# Patient Record
Sex: Male | Born: 1965 | Race: White | Hispanic: No | Marital: Married | Smoking: Current every day smoker
Health system: Southern US, Community
[De-identification: ages and names within clinical notes are randomized; demographics above are authoritative.]

## PROBLEM LIST (undated history)

## (undated) DIAGNOSIS — F419 Anxiety disorder, unspecified: Secondary | ICD-10-CM

## (undated) DIAGNOSIS — F909 Attention-deficit hyperactivity disorder, unspecified type: Secondary | ICD-10-CM

## (undated) DIAGNOSIS — F32A Depression, unspecified: Secondary | ICD-10-CM

## (undated) DIAGNOSIS — N400 Enlarged prostate without lower urinary tract symptoms: Secondary | ICD-10-CM

## (undated) DIAGNOSIS — J449 Chronic obstructive pulmonary disease, unspecified: Secondary | ICD-10-CM

## (undated) DIAGNOSIS — E785 Hyperlipidemia, unspecified: Secondary | ICD-10-CM

## (undated) DIAGNOSIS — R519 Headache, unspecified: Secondary | ICD-10-CM

## (undated) DIAGNOSIS — K219 Gastro-esophageal reflux disease without esophagitis: Secondary | ICD-10-CM

## (undated) HISTORY — DX: Anxiety disorder, unspecified: F41.9

## (undated) HISTORY — DX: Hyperlipidemia, unspecified: E78.5

## (undated) HISTORY — DX: Benign prostatic hyperplasia without lower urinary tract symptoms: N40.0

---

## 2001-12-04 ENCOUNTER — Encounter: Admission: RE | Admit: 2001-12-04 | Discharge: 2001-12-04 | Payer: Self-pay | Admitting: Otolaryngology

## 2001-12-04 ENCOUNTER — Encounter: Payer: Self-pay | Admitting: Otolaryngology

## 2008-01-24 ENCOUNTER — Ambulatory Visit (HOSPITAL_COMMUNITY): Admission: RE | Admit: 2008-01-24 | Discharge: 2008-01-24 | Payer: Self-pay | Admitting: Family Medicine

## 2010-11-30 NOTE — Procedures (Signed)
NAME:  [name], Jason [name]               ACCOUNT NO.:  [id number]   MEDICAL RECORD NO.:  [id number]          PATIENT TYPE:  OUT   LOCATION:  RAD                           FACILITY:  [facility]   PHYSICIAN:  Scott A. Gerda Diss, MD    DATE OF BIRTH:  1965-09-10   DATE OF PROCEDURE:  DATE OF DISCHARGE:                                  STRESS TEST   CHIEF COMPLAINT:  Chest discomfort.   RECENT HISTORY:  A 45 year old male smoker with intermittent chest pain  who was recently put on Protonix, which has seemed to help, but because  of family history and chest discomfort, a stress test was ordered.   Resting EKG:  No acute ST-segment changes were seen.  Normal sinus  rhythm.  He does have slight ST-segment baseline elevation in V3, V4,  and V5.  Stress test protocol was Bruce protocol.   Exercise tolerance:  He had good exercise tolerance.  He was able to get  heart rate up to a total of 167 in stage IV and blood pressures did  well.  He did not have any ST-segment depressions indicative of coronary  artery disease though did have a slight initial depression but it was  upsloping at a point but J point was normal.  No arrhythmias.  No  significant pathology except for some fatigue in the legs and had a  normal recovery span and normal blood pressure with no associated chest  pain and a normal EKG at recovery as well.  This is felt to be a  negative stress test.  He was urged to quit smoking and we will follow  through on this.      Scott A. Gerda Diss, MD  Electronically Signed     Scott A. Gerda Diss, MD  Electronically Signed    SAL/MEDQ  D:  01/24/2008  T:  01/24/2008  Job:  [id number]

## 2011-05-25 ENCOUNTER — Encounter (HOSPITAL_COMMUNITY): Payer: Self-pay | Admitting: Behavioral Health

## 2011-05-25 ENCOUNTER — Ambulatory Visit (INDEPENDENT_AMBULATORY_CARE_PROVIDER_SITE_OTHER): Payer: 59 | Admitting: Behavioral Health

## 2011-05-25 DIAGNOSIS — F411 Generalized anxiety disorder: Secondary | ICD-10-CM
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
## 2011-06-08 ENCOUNTER — Encounter (HOSPITAL_COMMUNITY): Payer: Self-pay | Admitting: Behavioral Health

## 2011-06-08 ENCOUNTER — Ambulatory Visit (INDEPENDENT_AMBULATORY_CARE_PROVIDER_SITE_OTHER): Payer: 59 | Admitting: Behavioral Health

## 2011-06-08 DIAGNOSIS — F411 Generalized anxiety disorder: Secondary | ICD-10-CM

## 2011-06-08 NOTE — Progress Notes (Signed)
THERAPIST PROGRESS NOTE  Session Time: 8:00  Participation Level: Active  Behavioral Response: Fairly GroomedAlertAnxious  Type of Therapy: Individual Therapy  Treatment Goals addressed: Anxiety  Interventions: CBT  Summary: [name] is a 45 y.o. male who presents with generalized anxiety.   Suicidal/Homicidal: Nowithout intent/plan  Therapist Response: The client indicated that he has been extremely busy with work. 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 The client indicates that he is thankful to have the work but it is difficult especially around holiday time to be working so much. He indicates there weeks where he only works 35 hours and weeks where he works a 60+ hours and that recently he has been on the upper end of the amount of hours worked. The client indicated that he does appreciate job of managing his stress at work and that the primary source of stress is within the home. He indicates that he feels at times that he has gained up on time with his wife and both of his daughters. He indicates that one of the biggest conflict between he and his wife is how they discipline their daughters. He indicated that he grew up in thought" spare the rod spoil the child." He indicates that he does not spank his daughters anymore but that he believes firmly and taking the keys away or allowance away with her phone away and his wife does not always enforce consequences which he thought they initially agreed on. He indicates that he feels when he goes to discipline his daughters his wife defends them and backs off the consequences that he tries to force. He indicated that his second biggest stressor is finances. In the previous session he indicated that he had begun showing his wife his entire check which he had not done for a couple  of years. He indicated that he thought showing her all of the check and when I went would make a difference but she still questioning where he was spending his money. He indicated that this week he has started going back to putting all of his expenses on credit cards so that he can show the receipts to his wife in hopes of leading a money trial so that he will not have to answer questions. He indicated that he will have to wait and see how his wife response to this. He indicates that he refuses to pack his lunch for work because he doesn't always have a place to heated up and that he spends no more than 8 or $9 for lunch depending on what is available to eat where he is working. He indicates that he always keeps cash in his pocket because he feels he is constantly getting one of his daughters money to go skating or to move he and his wife does not take that spending into account. He indicates that he has cut his phone time down substantially gradually over the past 4 or 5 years. He indicated that he was guilty of spending a lot of time planting when he was not working but that he now only thoughts during those season for [place] and does not want any other time of the year. He reported that he only went fishing 3 times this past summer and took his daughters every time. He indicated that he is  trying to get better about checking in with his wife instead of not turning his phone. He indicates that if he sees he is going to be late he checks in an independent in all 8 years he tends to check in on a more regular basis. He did indicate that his wife has responded to that in a more positive way he read he still feels that his wife does not trust him. He indicated that while he and his wife are separated that he was trying to help his brother-in-law with his business while his brother-in-law was sick. He indicated that the male in his brother-in-law's company was very pretty and his wife felt that he was doing something  that he should have and that evidently other men who work in a company said the client was being inappropriate with the male. The client denies that he did anything or said anything inappropriate indicates that he was not flirtatious. He indicates that his wife thinks that he at times is inappropriate with his 71 year old daughter he indicates that there is no physical relationship and he and his daughter but that they joke around a lot and he thinks that is what his wife thinks is inappropriate. The client indicates that the thought of being anything other than a father to his daughter is disgusting. The client continues indicate that he has never been unfaithful to his wife. He did indicate that before they got married he dated a lot and he thinks that plays into her mind set. The client indicates that he feels a lot of the issues between he and his wife argue to perception and he feels he is doing what he can do to change her perceptions. We will continue to explore what those perceptions aren't following sessions. He indicates that his wife has a" fantasy diversion" what family should look like and read stories on line which client feels are not realistic. He indicated that in many cases they are related to money and the client indicates that he tells his wife money does not make a family happy. The client recognizes that there are misperceptions poor communication within the family and he feels that's what needs to be worked on.  Plan: Return again in 2 weeks.  Diagnosis: Axis I: 300.02    Axis II: Deferred    French Ana, [facility] 06/08/2011

## 2011-06-29 ENCOUNTER — Encounter (HOSPITAL_COMMUNITY): Payer: Self-pay | Admitting: Behavioral Health

## 2011-06-29 ENCOUNTER — Ambulatory Visit (INDEPENDENT_AMBULATORY_CARE_PROVIDER_SITE_OTHER): Payer: 59 | Admitting: Behavioral Health

## 2011-06-29 DIAGNOSIS — F411 Generalized anxiety disorder: Secondary | ICD-10-CM
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
## 2011-07-14 ENCOUNTER — Ambulatory Visit (INDEPENDENT_AMBULATORY_CARE_PROVIDER_SITE_OTHER): Payer: 59 | Admitting: Behavioral Health

## 2011-07-14 DIAGNOSIS — F411 Generalized anxiety disorder: Secondary | ICD-10-CM

## 2011-07-15 ENCOUNTER — Encounter (HOSPITAL_COMMUNITY): Payer: Self-pay | Admitting: Behavioral Health
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
## 2011-07-26 ENCOUNTER — Encounter (HOSPITAL_COMMUNITY): Payer: Self-pay | Admitting: Behavioral Health

## 2011-07-28 ENCOUNTER — Encounter (HOSPITAL_COMMUNITY): Payer: Self-pay | Admitting: Behavioral Health

## 2011-07-28 ENCOUNTER — Ambulatory Visit (INDEPENDENT_AMBULATORY_CARE_PROVIDER_SITE_OTHER): Payer: 59 | Admitting: Behavioral Health

## 2011-07-28 NOTE — Progress Notes (Unsigned)
   THERAPIST PROGRESS NOTE  Session Time: 8:00  Participation Level: Active  Behavioral Response: Fairly GroomedAlertAnxious  Type of Therapy: Individual Therapy  Treatment Goals addressed: Anxiety  Interventions: CBT  Summary: Jason [name] is a 46 y.o. male who presents with anxiety.   Suicidal/Homicidal: Nowithout intent/plan  Therapist Response: The client entered the session indicating that he was beginning to get some kind of virus. He reportedly had no fever both coughing and had drainage and did not sleep well the past few nights. He appeared as if he had not slept well in indicated multiple times is very tired. He indicated that work stressors were playing a part because the other gentleman who helps to his job was out with pneumonia the client felt like he could not miss work. The client indicated that his biggest stressor currently is his relationship with his 36 year old daughter and how her behavior is affecting pulled the client family at this time. He indicated that they started a new medication approximately 2 or 3 weeks ago the client became even more agitated and was not going to sleep until 34 or 5:00 in the morning. Endocrine that her irritability increased substantially in the nothing anyone of the other family member said calmed her down and typically on made it worse. The client indicated that his wife took his daughter off of that medication. Advise him to speak to the doctor because most psychotropic medications need to be tapered off. The client indicated that with the exception of his daughter things have gone okay. He indicated that he and his wife and one little disagreement but for the most part they got along better he did give me a list of how she sees him which already after the session. He indicates that he sees himself as a good provider and for the most part her husband. 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 He did indicate that he knows his made mistakes his father especially when his daughter for younger is attempting to rectify that. He indicates that he knows he spent too much time based on his own hobbies and his family was younger but again is attempting to rectify that by spending less time hunting and fishing the client indicated that his anxiety level is about a 6 or 7 now it is a combination of pressures from work and issues with his daughter. We talked about coping skills the client uses. He does use being outdoors quite a bit as coping skills which has been somewhat difficult this time of year we talked about some breathing exercises which client will try.  Plan: Return again in 2 weeks.  Diagnosis: Axis I: 300.02    Axis II: Deferred    Sidharth Leverette M, LPC 07/28/2011

## 2011-08-11 ENCOUNTER — Ambulatory Visit (INDEPENDENT_AMBULATORY_CARE_PROVIDER_SITE_OTHER): Payer: 59 | Admitting: Behavioral Health

## 2011-08-11 DIAGNOSIS — F411 Generalized anxiety disorder: Secondary | ICD-10-CM

## 2011-08-18 ENCOUNTER — Encounter (HOSPITAL_COMMUNITY): Payer: Self-pay | Admitting: Behavioral Health

## 2011-08-18 DIAGNOSIS — F411 Generalized anxiety disorder: Secondary | ICD-10-CM | POA: Insufficient documentation

## 2011-08-18 NOTE — Progress Notes (Signed)
THERAPIST PROGRESS NOTE  Session Time: 8:00  Participation Level: Active  Behavioral Response: CasualAlertAnxious  Type of Therapy: Individual Therapy  Treatment Goals addressed: Anxiety  Interventions: CBT  Summary: Jason [name] is a 46 y.o. male who presents with anxiety.   Suicidal/Homicidal: Nowithout intent/plan  Therapist Response: I met for most of the session which is decline. He indicated that his wife expressed an interest in coming but she would probably be late and she did get to the session. She into the social about 10 minutes left. Prior to like him he we talked about the clients home life. He indicated that his biggest stressor is with his 11 year old daughter Jason [name]. The client indicates that she knows how to push his buttons but that he allows himself to fall into that trap. He indicates that he knows he has created a rift between himself and his daughter Jason [name] because he loses his patience and yells back to her. He indicates that he later apologizes but recognizes that he is dropping to her level in allowing her to take the power of him being a responsible parent away from him. He indicates that Jason [name] creates enough conflict in the home as it is but that he adds to it when he argues with her. He indicates that he knows he has lost most of her respect. He does appear to be genuinely concerned and has regrets about the way he has handled this relationship. He indicates that he has difficulty separating himself from work but that he knows he does a good job at work which a second by his wife's assessment of him. We spoke primarily about how the client can take the power back in what he can do to reduce conflict with both his daughter and his wife. After his wife can the session she and go to some the same sentiments she had done in the note that she provided form he. We talked about how the client relates to his daughter and the wife share her viewpoint on that saying that she  had a conversation with the daughter. The daughter said to the mother that she would like father who she could trust to sit down with and tell him what she is feeling that she does not like he can do that with the client. The client appeared to become tearful in your his wife say that for the first time. The wife and client and I talked about with the client continue to take the power back and how he can become the adult in relationship with his daughter. We just talked about communication between the client and his wife both admitted that they do a poor job of communicating with each other we briefly began to discuss communication style with each of them. The wife indicates that the client shuts down she gets series which the client knowledge indicating that he is tired talking about certain things and does not feel well but the difference. The client feels his wife overreacts to everything and does not trust him and that he saying what he is doing is correct. He also indicated that finances are a concern. Wife indicated that for years the client let her his paycheck but has stopped doing that until recently when he began doing that again. Client acknowledges that he has nothing to do with paying bills and he has to trust his wife with the money but that he feels she does not second limits with the daughter's as well as  he does. The clients wife acknowledge that fact. We also talked about cutting expenses including the truck that his 40 year old daughter striving which evidently takes about $20 every 2 days to keep running in gasoline. I recommended him to [facility] and talk to them about having any financial system to work with mother is accountability for everybody including her daughters. We will continue to meet with decline in his wife in the next session.  Plan: Return again in 2 weeks.  Diagnosis: Axis I: 300.02    Axis II: Deferred    Damoney Julia M, [facility] 08/18/2011

## 2011-08-25 ENCOUNTER — Ambulatory Visit (INDEPENDENT_AMBULATORY_CARE_PROVIDER_SITE_OTHER): Payer: 59 | Admitting: Behavioral Health

## 2011-08-25 DIAGNOSIS — F411 Generalized anxiety disorder: Secondary | ICD-10-CM

## 2011-08-26 ENCOUNTER — Encounter (HOSPITAL_COMMUNITY): Payer: Self-pay | Admitting: Behavioral Health

## 2011-08-26 NOTE — Progress Notes (Signed)
   THERAPIST PROGRESS NOTE  Session Time: 8:00  Participation Level: Active  Behavioral Response: CasualAlertAnxious  Type of Therapy: Family Therapy  Treatment Goals addressed: Coping  Interventions: CBT  Summary: YOUSUF [name] is a 46 y.o. male who presents with anxiety.   Suicidal/Homicidal: Nowithout intent/plan  Therapist Response: I met for the entire session with the client his wife and his 35 year-old daughter Jason [name]. The intention was to meet with the client and his wife saw someone surprised when his daughter walked in. The client is what indicated Jason [name] the daughter because when she woke up this morning she wrote on a piece of paper that she wanted to die and that she did not want to live and they were frightened. He showed me the paper in which she wrote that down on. The client started say to me that she really did not want to die and contracted for safety with me. She indicated that she does not want nor does she want to hurt herself or anyone else but that she just wants to live somewhere that speech will. Both client's daughter, client and his wife indicate that the house is almost constantly and chaos in that the 88 year old daughter has a part to play in it but that they need better ways of dealing with the client it with each other. We talked at length about anger triggers for the client's daughter in relation to the client and the clients mother. We also talked about anger triggers for the client in terms of his daughter and his wife. Communication among all family members needs to be improved as there is a triangle. It appears at times that the client overreacts and becomes angry following the daughter down the hallway she is angry. It appears as if the mother attempts to overcompensate for the father and he goes too far creating enmeshment. The father and his wife stated that he done better last 2 weeks as follows following her down the hall yelling but reports that he  typically follows her because when his daughter gets angry she throws and breaks that he was and is trying to keep her and everyone else safe. The daughter indicated that she gets triggered when she thinks for consequences run fair, when they follow her down the hall, and when she is not allowed to do what she wants to do. We talked about recognizing triggers physically and ourselves but spent the majority of our time working on communication within the family. I gave each homework as to why a list of strengths that the daughter sees in her parents and that each parent C's in the daughter. I will appear that with a list that the client and his wife make about each other. As the session close I did make sure that clients daughter contract for safety and she again said she had no intention of hurting herself or anyone else. Also spoke with the clients in the mother's therapist, Jason [name], to inform her of what was going on. Also asked them to schedule another appointment with Jason [name] if they had not done so recently.  Plan: Return again in 2 weeks.  Diagnosis: Axis I: 300.02    Axis II: Deferred    Xandra Laramee M, LPC 08/26/2011

## 2011-09-07 ENCOUNTER — Ambulatory Visit (HOSPITAL_COMMUNITY): Payer: Self-pay | Admitting: Behavioral Health

## 2011-09-22 ENCOUNTER — Ambulatory Visit (INDEPENDENT_AMBULATORY_CARE_PROVIDER_SITE_OTHER): Payer: 59 | Admitting: Behavioral Health

## 2011-09-22 ENCOUNTER — Encounter (HOSPITAL_COMMUNITY): Payer: Self-pay | Admitting: Behavioral Health

## 2011-09-22 DIAGNOSIS — F411 Generalized anxiety disorder: Secondary | ICD-10-CM
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
## 2011-10-06 ENCOUNTER — Encounter (HOSPITAL_COMMUNITY): Payer: Self-pay | Admitting: Behavioral Health

## 2011-10-06 ENCOUNTER — Ambulatory Visit (INDEPENDENT_AMBULATORY_CARE_PROVIDER_SITE_OTHER): Payer: 59 | Admitting: Behavioral Health

## 2011-10-06 DIAGNOSIS — F411 Generalized anxiety disorder: Secondary | ICD-10-CM
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
## 2011-10-20 ENCOUNTER — Ambulatory Visit (HOSPITAL_COMMUNITY): Payer: Self-pay | Admitting: Behavioral Health

## 2011-10-27 ENCOUNTER — Encounter (HOSPITAL_COMMUNITY): Payer: Self-pay | Admitting: Behavioral Health

## 2011-10-27 ENCOUNTER — Ambulatory Visit (INDEPENDENT_AMBULATORY_CARE_PROVIDER_SITE_OTHER): Payer: 59 | Admitting: Behavioral Health

## 2011-10-27 DIAGNOSIS — F411 Generalized anxiety disorder: Secondary | ICD-10-CM
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
## 2011-11-08 ENCOUNTER — Ambulatory Visit (INDEPENDENT_AMBULATORY_CARE_PROVIDER_SITE_OTHER): Payer: 59 | Admitting: Family Medicine

## 2011-11-08 ENCOUNTER — Encounter: Payer: Self-pay | Admitting: Family Medicine

## 2011-11-08 VITALS — BP 120/75 | HR 86 | Ht 67.0 in | Wt 159.0 lb

## 2011-11-08 DIAGNOSIS — Z23 Encounter for immunization: Secondary | ICD-10-CM

## 2011-11-08 DIAGNOSIS — Z13220 Encounter for screening for lipoid disorders: Secondary | ICD-10-CM

## 2011-11-08 DIAGNOSIS — R209 Unspecified disturbances of skin sensation: Secondary | ICD-10-CM

## 2011-11-08 DIAGNOSIS — D696 Thrombocytopenia, unspecified: Secondary | ICD-10-CM

## 2011-11-08 DIAGNOSIS — R202 Paresthesia of skin: Secondary | ICD-10-CM

## 2011-11-08 NOTE — Progress Notes (Signed)
Subjective:    Patient ID: Jason [name], male    DOB: 08/03/1965, 46 y.o.   MRN: [id number]  HPI Has occ reflux sxs but says usually just alters what he eats and it gets better.    Lump on back for years. Says not bothersome or itchy. Wife says getting larger and is concerned.   Occ will get numbness on the side of his foot and oc the left arm goes to sleep. Sometimes just the hand and sometimes the whole arm.  It will tingle.  Says has been going on for about 6 months.  Says mostly bothers him at night. No weakness or loss of strength.  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  No old injury to the foot.   Told has had low platelets in the past. About 10 years ago. Hasn't had it checked since.    Review of Systems  Constitutional: Negative for fever, diaphoresis and unexpected weight change.  HENT: Negative for hearing loss, rhinorrhea, sneezing and tinnitus.        + hay fever.   Eyes: Positive for visual disturbance.  Respiratory: Positive for cough. Negative for wheezing.   Cardiovascular: Negative for chest pain and palpitations.  Gastrointestinal: Negative for nausea, vomiting, diarrhea and blood in stool.  Genitourinary: Negative for dysuria and discharge.       Nocturia +  Musculoskeletal: Positive for myalgias and joint swelling. Negative for arthralgias.  Skin: Negative for rash.  Neurological: Positive for tremors. Negative for headaches.  Hematological: Negative for adenopathy.  Psychiatric/Behavioral: Positive for sleep disturbance. Negative for dysphoric mood. The patient is not nervous/anxious.        BP 120/75  Pulse 86  Ht 5\' 7"  (1.702 m)  Wt 159 lb (72.122 kg)  BMI 24.90 kg/m2    Allergies  Allergen Reactions  . Seasonal     Sinus and head pressure    Past Medical History  Diagnosis Date  . Anxiety     History reviewed. No pertinent past surgical history.  History   Social History  . Marital Status: Married    Spouse Name: [name]    Number of  Children: 2  . Years of Education: HS   Occupational History  . Radio broadcast assistant.      [employer]   Social History Main Topics  . Smoking status: Current Everyday Smoker -- 1.0 packs/day    Types: Cigarettes  . Smokeless tobacco: Never Used  . Alcohol Use: 0.0 oz/week    0 Cans of beer per week     drinks maybe once a month.    . Drug Use: No  . Sexually Active: Yes   Other Topics Concern  . Not on file   Social History Narrative   No regular exercise but physical job.  1 pot of coffee a day.     Family History  Problem Relation Age of Onset  . Bipolar disorder Father   . Alcohol abuse Paternal Uncle   . Heart attack Father 34    pacemaker, smoker  . Hypertension    . Alcohol abuse      No outpatient encounter prescriptions on file as of 11/08/2011.       Objective:   Physical Exam  Constitutional: He is oriented to person, place, and time. He appears well-developed and well-nourished.  HENT:  Head: Normocephalic and atraumatic.  Cardiovascular: Normal rate, regular rhythm and normal heart sounds.   Pulmonary/Chest: Effort normal and breath sounds normal.  Musculoskeletal:  Neck and shoulders with normal range of motion. He does have pain in both shoulders when he raises them above 90. Shoulder, elbow, wrist, and her strength is 5 over 5 bilaterally. He has a negative Tinel's and Phalen's sign. Handgrip is normal. Left ankle with normal range of motion. Dorsal pedal and posterior tibial pulses 2+. No signs of cyanosis. Normal cap refill. Normal sensation along the lateral edge of the foot and over all of the toes. No tenderness over the foot or rash.  Neurological: He is alert and oriented to person, place, and time.  Skin: Skin is warm and dry.  Psychiatric: He has a normal mood and affect. His behavior is normal.          Assessment & Plan:  Numbness left arm- his balance himself and was negative. This carpal tunnel is less likely but still a  possibility such as some symptoms are predominantly at night. That also could be positional. The ligament shoulder Mr. position and may also recreate his symptoms. He does have a history of rotator cuff tears bilaterally. He also has a history of osteoarthritis of the neck is on x-rays done approximately 10 years ago. Offered to rex-ray his neck today to see if some of his symptoms could be coming from but he declined. He says that he is not having any pain he really doesn't want to work it up any further. I did discuss with him if he starts to notice any weakness in the hand or arm or foot but that does require further workup and I would encourage him to come back in to be seen at that point. We will rule out diabetes evaluate for thyroid disorder.  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 Certainly if cosmetically he would like to have this removed we could certainly refer him to a general surgeon. It is nontender there is no rash. It is soft and consistent with fatty tissue. Though it is more oval in shape versus round.  Numbness of the lateral left foot-recommend further evaluation by podiatry for possible orthotics. I think this would make a big difference. He's on his feet all day and wears work boots. Consider this could also be coming from his back but he has never sciatic type symptoms he does not have any low back pain currently.  Other to get a screening cholesterol and CMP.  He reports a history of low platelets a recheck a CBC with differential today.

## 2011-11-08 NOTE — Patient Instructions (Signed)
We will call you with your lab results. If you don't here from us in about a week then please give us a call at [phone].  

## 2011-11-09 LAB — CBC WITH DIFFERENTIAL/PLATELET
Basophils Absolute: 0 10*3/uL (ref 0.0–0.1)
Basophils Relative: 1 % (ref 0–1)
Eosinophils Absolute: 0.1 10*3/uL (ref 0.0–0.7)
Eosinophils Relative: 2 % (ref 0–5)
HCT: 50.1 % (ref 39.0–52.0)
Hemoglobin: 15.9 g/dL (ref 13.0–17.0)
Lymphocytes Relative: 34 % (ref 12–46)
Lymphs Abs: 1.7 10*3/uL (ref 0.7–4.0)
MCH: 28.8 pg (ref 26.0–34.0)
MCHC: 31.7 g/dL (ref 30.0–36.0)
MCV: 90.8 fL (ref 78.0–100.0)
Monocytes Absolute: 0.4 10*3/uL (ref 0.1–1.0)
Monocytes Relative: 7 % (ref 3–12)
Neutro Abs: 2.9 10*3/uL (ref 1.7–7.7)
Neutrophils Relative %: 57 % (ref 43–77)
Platelets: 145 10*3/uL — ABNORMAL LOW (ref 150–400)
RBC: 5.52 MIL/uL (ref 4.22–5.81)
RDW: 13.3 % (ref 11.5–15.5)
WBC: 5.1 10*3/uL (ref 4.0–10.5)

## 2011-11-09 LAB — LIPID PANEL
Cholesterol: 178 mg/dL (ref 0–200)
HDL: 53 mg/dL (ref >39)
LDL Cholesterol: 115 mg/dL — ABNORMAL HIGH (ref 0–99)
Total CHOL/HDL Ratio: 3.4 Ratio
Triglycerides: 50 mg/dL (ref <150)
VLDL: 10 mg/dL (ref 0–40)

## 2011-11-09 LAB — COMPLETE METABOLIC PANEL WITH GFR
ALT: 15 U/L (ref 0–53)
AST: 19 U/L (ref 0–37)
Albumin: 4.3 g/dL (ref 3.5–5.2)
Alkaline Phosphatase: 56 U/L (ref 39–117)
BUN: 14 mg/dL (ref 6–23)
CO2: 28 mEq/L (ref 19–32)
Calcium: 9 mg/dL (ref 8.4–10.5)
Chloride: 104 mEq/L (ref 96–112)
Creat: 1.07 mg/dL (ref 0.50–1.35)
GFR, Est African American: >89 mL/min
GFR, Est Non African American: 83 mL/min
Glucose, Bld: 89 mg/dL (ref 70–99)
Potassium: 4.3 mEq/L (ref 3.5–5.3)
Sodium: 139 mEq/L (ref 135–145)
Total Bilirubin: 0.7 mg/dL (ref 0.3–1.2)
Total Protein: 6.1 g/dL (ref 6.0–8.3)

## 2011-11-09 LAB — TSH: TSH: 1.369 u[IU]/mL (ref 0.350–4.500)

## 2011-11-09 LAB — HEMOGLOBIN A1C
Hgb A1c MFr Bld: 5.4 % (ref <5.7)
Mean Plasma Glucose: 108 mg/dL (ref <117)

## 2011-11-10 ENCOUNTER — Ambulatory Visit (INDEPENDENT_AMBULATORY_CARE_PROVIDER_SITE_OTHER): Payer: 59 | Admitting: Behavioral Health

## 2011-11-10 ENCOUNTER — Encounter (HOSPITAL_COMMUNITY): Payer: Self-pay | Admitting: Behavioral Health

## 2011-11-10 DIAGNOSIS — F411 Generalized anxiety disorder: Secondary | ICD-10-CM

## 2011-11-10 NOTE — Progress Notes (Signed)
   THERAPIST PROGRESS NOTE  Session Time: 8:00  Participation Level: Active  Behavioral Response: CasualAlertAnxious  Type of Therapy: Individual Therapy  Treatment Goals addressed: Anxiety  Interventions: CBT  Summary: Jason [name] is a 46 y.o. male who presents with anxiety.   Suicidal/Homicidal: Nowithout intent/plan  Therapist Response: The client entered the session indicating that he is exhausted. He said that he has 2 or 3 guys out of work who typically there to help him because of injured backs or other injuries. He indicates that he is working about 12 hours a day and getting home at 67 or 8:00 at night and things are chaotic at home. He said that he received a phone call from his oldest daughters teacher saying that she missed 8 team days of school and that she was failing [place]. The client's daughter is a Holiday representative. He indicated that he spoke to the daughter who said that she did not care that she was failing [place]. He spoke to his wife and they both spoke to the daughter. The client questions was a daughter maybe using marijuana but that she has no interest in anything except for please her and she does not care. He said that he is drug testing her. He indicated that set some very firm boundaries with her saying that they will take her car away if she misses one more day of school and does not poor grades up quickly. That creates an intravenous with the client and his wife as the client works 2 jobs. He says he no she works hard but she also has plenty of time to getting his homework done. He cited the previous night the time she did not get home until 10:00 and said that she was in a restaurant doing homework with friends he saw no proof of. He also indicates that his younger daughter continues to be extremely oppositional and that his relationship with her is very poor. He indicated that he and his wife are doing better about working together in terms of setting limits but there  still is a lot of work to do. He indicates that he is trying to back off of being the heavy-handed disciplinarian but that he feels his wife needs to step up to the plate and be more consistent in her treatment of the clients 2 daughters. We again stressed the importance of consistency in conversation between he and his wife prior to working with their daughters. We talked about relaxation techniques and skills for the client. He indicates that he is using some which were helpful in the short-term but he stays exhausted all of the time. I may want to consider her having a conversation with the client about medication to do with anxiety.  Plan: Return again in 2 weeks.  Diagnosis: Axis I: 300.02    Axis II: Deferred    French Ana, [facility] 11/10/2011

## 2011-12-01 ENCOUNTER — Ambulatory Visit (INDEPENDENT_AMBULATORY_CARE_PROVIDER_SITE_OTHER): Payer: 59 | Admitting: Behavioral Health

## 2011-12-01 DIAGNOSIS — F411 Generalized anxiety disorder: Secondary | ICD-10-CM

## 2011-12-02 ENCOUNTER — Encounter (HOSPITAL_COMMUNITY): Payer: Self-pay | Admitting: Behavioral Health

## 2011-12-02 NOTE — Progress Notes (Signed)
   THERAPIST PROGRESS NOTE  Session Time: 8:00  Participation Level: Active  Behavioral Response: CasualAlertAnxious  Type of Therapy: Individual Therapy  Treatment Goals addressed: Anxiety  Interventions: CBT  Summary: Jason [name] is a 46 y.o. male who presents with anxiety.   Suicidal/Homicidal: Nowithout intent/plan  Therapist Response: The client entered the session late looking as if he was extremely stressed out. He indicated that he was late because he was waiting on his youngest daughter to get ready to come with them because he felt they needed to be in the session together. He stated that they are not getting along well at all and nothing he is attempting to try to repair the relationship is working. He indicated that he fell he apparently on her because she was dragging her feet getting ready. He indicated that his stress level is extremely high. He stated that work is very busy and that he does not have enough help and even though somehow hired he does not have the time to training and like he would like to. He indicated that his wife has health issues which is making her feel poorly and that his daughters are being oppositional. I applauded him for the work that he is doing attempting to repair the relationship with his youngest daughter reported that may become before she reciprocates. He indicated that he can't do much control work but can limit the amount of hours that he is putting in. He did say seeing home and he said time but he has a wide open all day long and there is no down time. 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 Also suggested as he had discussed in previous sessions that he talk to his wife over home. The client understands his wife works out of the home and does not have much of a conversation. I suggested that he stay as assertively and respectively is possible that he needs 10-15  minutes of down time on the ride home so he can be better prepared to support her in her health issues and to support her in taking care of their daughters. He indicated that he would try that. He did report that he is wife are doing much better job of presenting united front in terms of consequences and that his youngest daughter in particular his fight back against it. I told her that was normal what they needed to stick to consistent consequences and not allow the disrespect that he describes. He indicated for example that his younger daughter lost her I phoned and was accusing them of not helping her find it calling both he and the mother if he has and screaming at him. He indicated he has no tolerance for disrespect and it helped for the phone although was not found. He indicated that his wife talked about consequences for the lack of respect towards with her youngest daughter. We talked about what some of his consequences are and what his expectations are in terms of respect speaking clearly communicate this to the daughter.  Plan: Return again in 2 weeks.  Diagnosis: Axis I: 300.02    Axis II: Deferred    French Ana, [facility] 12/02/2011

## 2011-12-15 ENCOUNTER — Encounter (HOSPITAL_COMMUNITY): Payer: Self-pay | Admitting: Behavioral Health

## 2011-12-15 ENCOUNTER — Ambulatory Visit (INDEPENDENT_AMBULATORY_CARE_PROVIDER_SITE_OTHER): Payer: 59 | Admitting: Behavioral Health

## 2011-12-15 DIAGNOSIS — F411 Generalized anxiety disorder: Secondary | ICD-10-CM

## 2011-12-15 NOTE — Progress Notes (Signed)
THERAPIST PROGRESS NOTE  Session Time: 8:00  Participation Level: Active  Behavioral Response: CasualAlertAnxious  Type of Therapy: Individual Therapy  Treatment Goals addressed: Coping  Interventions: CBT  Summary: Jason [name] is a 46 y.o. male who presents with anxiety.   Suicidal/Homicidal: Nowithout intent/plan  Therapist Response: The client indicated that the last 2 weeks have been a stressful as before but for someone different reasons. He indicated that he was talking to his father on the phone and all of a sudden his father was not able to speak and had difficulty breathing. He indicated that his brother happened to be in his father's home at the time interest him to the hospital with a complex as long in his legs. The client indicated that as much as he dislikes how his father treated him growing up but still treats him he still is his father and the client respect him and does not want to do any harm but suspects that all of the health issues that the father is experiencing will either directly contribute to his death are leading to the point with the client even thinks it the father may be capable of suicide. He indicates that he is talked with father about that his father does not comment much. He indicates that his wife wants him to tell the father how he has felt about the ways been treated all these years but the client indicates that at this point time his father has not changed and he wants to leave his relationship with her current with his father as much is possible. He indicates that when his father does not he won't know that even though his father did not treat him respectively that he treated his father with respect. The client indicated that on top of that work continues to be taking about 12 hours of his times daily. He indicates that he comes in for work exhausting and a because his wife is not feeling well physically he is having to contribute more to helping out  around the house. He indicates he does not mind because he knows his wife does not feel well but that has made her more irritable. He is also very thankful that his wife is finally found a Dr. Earlene Plater going on for years with the clients wife's racing heartbeat and some other physical symptoms. He indicated that he knows now that medication needs to be adjusted but he is seeing some progress. He also said that his oldest daughter has made some very poor decisions. She was in the mall with some" friends" and one of the male friends was called for stealing from a store. Because the clients door was with the male she to has been charged with larceny of that he says she continually denies having a part in that other going back into the store to get her friends. The client also said that his daughter got a speeding ticket for going 70 and 55 2 days later. He indicates that he told the client's daughter that she was overall and in working through this. He recommended what to do about some attorneys but said that she has to begin to learn how to act like an adult that she tells him she always is. He is also concerned because he's not sure that she will graduate. He did report that his relationship with his youngest daughter is better because she lost her phone they have not replaced it so the youngest daughter spending more time  with the client and his wife is supposed in her room isolating. The client did indicate that he set boundaries as far as the phone on the way home. He indicates a causal his wife is aware home and typically does not speak want to hurt nor does he take a phone call from work. He said he is attempting to set those memories on the ride to work in the morning also. He also said that he still continues to be poorly because he is so busy work it's mid to late afternoon before his lunch and that he doesn't eat until 8:30 or 9. We talked about his choice of food even when he is eating in a hurry and how  he can make that better and how that would and heard helping to letter. Risk talked about coping skills for anxiety and stress and asked the client to practice and. I will provide him with a relaxation CD in the next session. The client does contract for safety indicating yes or thoughts of hurting himself. He indicates his depression level is not that bad but his anxiety level is extremely high.  Plan: Return again in 2 weeks.  Diagnosis: Axis I: 300.02    Axis II: Deferred    Senaya Dicenso M, LPC 12/15/2011

## 2011-12-29 ENCOUNTER — Ambulatory Visit (INDEPENDENT_AMBULATORY_CARE_PROVIDER_SITE_OTHER): Payer: 59 | Admitting: Behavioral Health

## 2011-12-29 ENCOUNTER — Encounter (HOSPITAL_COMMUNITY): Payer: Self-pay | Admitting: Behavioral Health

## 2011-12-29 DIAGNOSIS — F411 Generalized anxiety disorder: Secondary | ICD-10-CM

## 2011-12-29 NOTE — Progress Notes (Signed)
THERAPIST PROGRESS NOTE  Session Time: 8:00  Participation Level: Active  Behavioral Response: CasualAlertAnxious  Type of Therapy: Individual Therapy  Treatment Goals addressed: Anxiety  Interventions: CBT  Summary: Jason [name] is a 46 y.o. male who presents with anxiety.   Suicidal/Homicidal: Nowithout intent/plan  Therapist Response: The client indicated that he was less distressed and he was in the previous session. He did indicate that he is carving out time for herself in her home only calling his wife on up to tell her that he is coming home and not taking any work calls. He also indicated that he is balding nature sounds CD which helps him relax on the way home. He still indicates that there is significant stress at home. He reports that there still trying to get his wife's medication adjusted for dealing with her thyroid issues therefore there still significant mood swings. He indicates that he is doing his best to stay calm and not reactive feels at times as if he leaves work and comes home and works until he goes to bed. He indicates that his wife seems to be signing more fault in the fact that he is not doing enough at home or not spending enough time with family but that he is also not making enough money. He indicates that he has no idea how to balance on a. He indicates that he makes pretty good money at what he does he knows that he will not do raise. He bounces out with the fact that he knows the job market is very difficult and that he can't quit his job because he knows or play other people his boss could hire. He indicates he's been doing this for 20 some years and does not necessarily want to change careers also known that he probably cannot make the money is making somewhere else. He indicates that he feels he comes out of the home cooking dinner so much a week as well as helping clean the house. He does recognize that it must be difficult for his wife going through  the mood swings and dealing with the body temperature changes. He indicates that his wife's death of her Mircette set 69 Sundays when he comes home he and his daughter have to wear jacket. He did report that his relationship with his youngest daughter seems to be improving. He indicates that they actually do spent some time talking male which was not happening and that she does not appear he came all the time. He did say that his oldest daughter is still dealing with the issues of the larceny charges of which he does believe she's not guilty of but she was with people who did attempt to still something from a store. He also indicates that she has to deal with the speeding ticket. He did report that she is doing some of the financial responsibility for that but is not making enough to cover everything so that's financial stress to his situation. He indicates that at times his wife still brings up the fact that she he is not being honest her about his pay check and finances. He indicates a part of the comes from his wife's knowledge of his father spending habits. He believes that his father may be bipolar because his father will Borrow money or spend money randomly no one knows what is being used on. The client indicates that he shows his wife his paycheck. He also indicates that from time to time trust issues  come up. He reports that he thinks his wife deathly hasn't self image issues although she did decline is a beautiful woman. He indicates that he has never been unfaithful in any way to his wife. He indicates that he does appreciate a beautiful woman but never makes a comment on it. He indicates that if his wife thinks that he is looking at a beautiful woman and she said something to him. He referred back to his situation in which coworkers at one time told his wife that he may have been being unfaithful with a woman in the office. He indicated that he was his brother-in-law's company and that he did have work  closely with a woman but that he was not flirtatious and said or did nothing that even approach being inappropriate. He did say that in issues before the marriage he dated lot of women and at one point time before they married said he was afraid that he would not be unfaithful to her. The client wonders if that still plays in her head. He indicates that he reassures her on a continual basis but he doesn't want to be with anybody else and he has never been unfaithful to her in any way but is not sure that she believes that at times. I told him that once he felt talked with that his wife was stabilized on her medication for her thyroid issues and we could sit down and do some couples work. He welcomed  that opportunity.  Plan: Return again in 2 weeks.  Diagnosis: Axis I: 300.02    Axis II: Deferred    French Ana, [facility] 12/29/2011

## 2012-01-16 ENCOUNTER — Ambulatory Visit (INDEPENDENT_AMBULATORY_CARE_PROVIDER_SITE_OTHER): Payer: 59 | Admitting: Behavioral Health

## 2012-01-16 ENCOUNTER — Encounter (HOSPITAL_COMMUNITY): Payer: Self-pay | Admitting: Behavioral Health

## 2012-01-16 DIAGNOSIS — F411 Generalized anxiety disorder: Secondary | ICD-10-CM
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
## 2012-02-01 ENCOUNTER — Encounter (HOSPITAL_COMMUNITY): Payer: Self-pay | Admitting: Behavioral Health

## 2012-02-01 ENCOUNTER — Ambulatory Visit (INDEPENDENT_AMBULATORY_CARE_PROVIDER_SITE_OTHER): Payer: 59 | Admitting: Behavioral Health

## 2012-02-01 DIAGNOSIS — F411 Generalized anxiety disorder: Secondary | ICD-10-CM

## 2012-02-01 NOTE — Progress Notes (Signed)
   THERAPIST PROGRESS NOTE  Session Time: 8:00  Participation Level: Active  Behavioral Response: CasualAlertAnxious  Type of Therapy: Individual Therapy  Treatment Goals addressed: Coping  Interventions: CBT  Summary: Jason [name] is a 46 y.o. male who presents with anxiety.   Suicidal/Homicidal: Nowithout intent/plan  Therapist Response: The client indicated that he was extremely fatigued. He indicated that reports still very busy with the past few days at a been temperature was in the upper 90s and humid it made him extremely tired. He indicates that his wife's thyroid issues connected to graves disease have made her feel extremely uncomfortable in her doctor has now discontinued her medication to start a trial something different. He indicated that her clients have been extremely swollen and she is not sleeping well therefore he has not slept well the past few nights. He indicates that he has average 45 hours of sleep per night over the past 3-4 nights. We talked about a sleep medication that had been prescribed for him the past. He indicated that he probably need to speak to his doctor about that . I also informed him of melatonin which he can over-the-counter as a sleep aid. He indicated that he continues to have issues with his oldest daughter. He indicates that legal issues and not yet settled in that she lost her job over the weekend because she thought she was not supposed to go when he was supposed to work and lost her job. He reported that he saw her phone a picture of her blowing smoke. She told him she was smoking marijuana and that she didn't see any harm him. He indicated that he did not appreciate or do and that was not going to allow anything like that and house in wonder of all the dangers of smoking not to mention it's a legal. He also said they found out from talking to his brother to his 47 year old father have been smoking marijuana with the neighbor and his father  continues to sneak alcohol even though his doctor has told him specifically not to drink alcohol. He indicated that his youngest daughter [name] had been somewhat reclusive again over the past few days even though he felt the relationship but got better. The client indicated that he is having a hard time maintaining a positive attitude but knows he needs to as his wife does not feel well and is doing all she can do. The client is to certainly process is going on in his life. We also talked about reviewed coping skills to deal with stress and anxiety. Encouraged him to try and find at least 15-30 minutes a day for himself.   Plan:  Return again in 2 weeks.  Diagnosis: Axis I: 300.02    Axis II: Deferred    Sondos Wolfman M, [facility] 02/01/2012

## 2012-02-15 ENCOUNTER — Ambulatory Visit (HOSPITAL_COMMUNITY): Payer: Self-pay | Admitting: Behavioral Health

## 2012-03-01 ENCOUNTER — Ambulatory Visit (INDEPENDENT_AMBULATORY_CARE_PROVIDER_SITE_OTHER): Payer: 59 | Admitting: Behavioral Health

## 2012-03-01 ENCOUNTER — Encounter (HOSPITAL_COMMUNITY): Payer: Self-pay | Admitting: Behavioral Health

## 2012-03-01 DIAGNOSIS — F411 Generalized anxiety disorder: Secondary | ICD-10-CM

## 2012-03-01 DIAGNOSIS — F419 Anxiety disorder, unspecified: Secondary | ICD-10-CM

## 2012-03-01 NOTE — Progress Notes (Signed)
   THERAPIST PROGRESS NOTE  Session Time: 8:00  Participation Level: Active  Behavioral Response: CasualAlertpleasant  Type of Therapy: Family Therapy  Treatment Goals addressed: Coping  Interventions: CBT  Summary: Jason [name] is a 46 y.o. male who presents with anxiety.   Suicidal/Homicidal: Nowithout intent/plan  Therapist Response: I met with the client and his wife for the entire session. We processed some frustrations with the clients health issues. She has had thyroid issues and has been diagnosed with Graves disease. The clients wife was very complimentary in how the client had helped take care of her, and her children and their house when she either physically could not or did not feel like doing anything. We also process some issues they're having with the clients oldest daughter and the stress that she is causing them. I recommended therapy for their oldest daughter. Mother indicated that she made appointments and the daughter would refuse to go. He indicated that she is smoking marijuana and she sees no issue with that. He indicated that they no she is struggling with self-esteem issues we talked about ways that they could work with her in building that the client acknowledged his wife confirms that he can do a much better job of being positive with the client and her sister. He acknowledges that it is something that he struggles with her work also. He indicates that no matter how well his daughters do or how well his employees do he is hard time raising them but is quick to point out the negatives. We will continue to talk about that in the next session. The wife also pointed out that recently the client has told her that he was working late when in fact he was quite hunting. He indicated that he had done significantly better in reducing the amount of time that he was doing things that he wanted to do and feels that he actually told her that he was going hunting that she would  tell it he could not or get angry with him and he didn't want that. Wife indicated that she knew she had been hard to live with over the past year because of her health issues but that she recognized that he needed some stress relief also. She said that if he was later noted that he was thinking about going a certain day constant after work that she would be fine with that and not become upset. We also talked about the clients use of laughter or humor to avoid dealing with a difficult conversation or topic. We will continue to deal with that in the next session.  Plan: Return again in 2 weeks.  Diagnosis: Axis I: 300.0    Axis II: Deferred    French Ana, [facility] 03/01/2012

## 2012-04-12 ENCOUNTER — Encounter (HOSPITAL_COMMUNITY): Payer: Self-pay | Admitting: Behavioral Health

## 2012-04-12 ENCOUNTER — Ambulatory Visit (INDEPENDENT_AMBULATORY_CARE_PROVIDER_SITE_OTHER): Payer: 59 | Admitting: Behavioral Health

## 2012-04-12 DIAGNOSIS — F411 Generalized anxiety disorder: Secondary | ICD-10-CM

## 2012-04-12 NOTE — Progress Notes (Signed)
   THERAPIST PROGRESS NOTE  Session Time: 8:00  Participation Level: Active  Behavioral Response: CasualAlerttired  Type of Therapy: Individual Therapy  Treatment Goals addressed: Coping  Interventions: CBT  Summary: Jason [name] is a 46 y.o. male who presents with anxiety.   Suicidal/Homicidal: Nowithout intent/plan  Therapist Response: The client presented to him session today looking physically tired. He indicated that his allergies have really been acting out and that he has not been sleeping as well. He indicated that he is exhausted because work is busy and his wife's health issues related to her thyroid admit her very tired and irritable. He said that he never knows what he is  walking into in terms of how his wife is feeling for the day how her mood will be. he stated that he is attempting to stay positive, focused, and encouraging with his wife at times runs out of energy. He indicates that some days she is able to help another days after working all day and attempting to do to all Classes R. she does lay on the couch so he is a making better cleaning the house etc. He indicates that he does not mind helping in all but feels emotionally drained because he cannot help his wife stay focused on a positive track. He indicates that they are still attempting to adjust her medication but have not found the right combination that makes a significant difference for her. He indicates that she's always tired is often irritable is always hot. He jokingly said that he and his daughter syndrome house umbilicus R. she drinks year. He did indicate that his youngest daughter has become much more helpful around the house but their relationship has improved significantly. He indicates that he has taken a different approach with her and has backed off and appears to be reading the positive benefits from that. He indicated his older daughter has for the most part cleared up her legal issues and is working  on [employer] but appears to be stuck in a place where she cannot be motivated to do anything. He indicates that she is working 20 hours a week but otherwise has very little motivation. He indicated that conversation last night appeared to help some. He says he still has expectations especially since she's at home and only working part-time that she pick up more slapped around the house that his wife cannot do. We reviewed coping skills and I gave the client relaxation CD for both he and his wife to try and see if it work. He did contract for safety saying he is not thinking of hurting himself. He did say that he is hunting some with his brother which is his only form of relaxation escape right now but he is walking a 5 on has how much time he spends doing that he cannot be away from home very much.   Plan: Return again in 2 weeks.  Diagnosis: Axis I: 300.02    Axis II: Deferred    Najeeb Uptain M, [facility] 04/12/2012

## 2012-04-26 ENCOUNTER — Ambulatory Visit (HOSPITAL_COMMUNITY): Payer: Self-pay | Admitting: Behavioral Health

## 2012-05-10 ENCOUNTER — Ambulatory Visit (HOSPITAL_COMMUNITY): Payer: Self-pay | Admitting: Behavioral Health

## 2012-05-10 ENCOUNTER — Telehealth (HOSPITAL_COMMUNITY): Payer: Self-pay | Admitting: Behavioral Health

## 2012-05-10 NOTE — Telephone Encounter (Signed)
I called the client who indicated that he forgot his 8:00 appointment this morning. He was extremely apologetic. He is going to call back and set up another appointment.

## 2012-08-15 ENCOUNTER — Encounter: Payer: Self-pay | Admitting: Family Medicine

## 2012-08-15 DIAGNOSIS — Z720 Tobacco use: Secondary | ICD-10-CM | POA: Insufficient documentation

## 2012-10-19 ENCOUNTER — Ambulatory Visit (INDEPENDENT_AMBULATORY_CARE_PROVIDER_SITE_OTHER): Payer: 59 | Admitting: Behavioral Health

## 2012-10-19 ENCOUNTER — Encounter (HOSPITAL_COMMUNITY): Payer: Self-pay | Admitting: Behavioral Health

## 2012-10-19 DIAGNOSIS — F411 Generalized anxiety disorder: Secondary | ICD-10-CM

## 2012-10-19 NOTE — Progress Notes (Signed)
   THERAPIST PROGRESS NOTE  Session Time: 8:00  Participation Level: Active  Behavioral Response: CasualAlertAnxious  Type of Therapy: Individual Therapy  Treatment Goals addressed: Coping  Interventions: CBT  Summary: Jason [name] is a 47 y.o. male who presents with anxiety.   Suicidal/Homicidal: Nowithout intent/plan  Therapist Response: Have not seen decline since September. He stated that a lot has been going on since had seen him. He indicated to work and increased significantly in the past 3 months which was thankful for although did become almost too much saying he is working 10 hours a day most days. He did receive a dollar per hour increase which he was appreciative. He did state that things continue to improve with he and his daughter Jason [name] although she did have to be hospitalized a few months ago and they were still incidences in which she acted out toward himself and toward his wife. He stated that he has done a better job of controlling his anger and that he and his wife are doing much better job of presenting united front. He indicated his biggest challenge now is trying to balance everything. He indicates his wife continues to have health issues related to graves disease and that has kept her from wanting to get out or do much of anything is because she does not feel like physically or she has some social anxieties. He indicates that he feels he's been helpful around the house but doing most of the cleaning and some nights doing cooking said she is trying to balance health issues, work, and school. He also indicates that he feels like he needs to be of some help to his parents but they live 2 hours away in finding time to get there is difficult. We talked about. Down his responsibilities to the most basic once he feels like he can handle. We talked about the type things that he is doing for coping. I suggested that he find a time to go [place] hunting with his brother while  providing a massage for his wife in getting his 49 year old daughter to play with friends and they go have some down time. Indicates that he is tired all the time but feels like he is doing better job of managing and continues to work on improving his relationship with his daughters and his wife. He does contract for safety saying yes or thoughts of hurting himself or anyone else.  Plan: Return again in 8 weeks.  Diagnosis: Axis I: 300.02    Axis II: Deferred    Molleigh Huot M, [facility] 10/19/2012

## 2012-12-19 ENCOUNTER — Ambulatory Visit (INDEPENDENT_AMBULATORY_CARE_PROVIDER_SITE_OTHER): Payer: 59 | Admitting: Behavioral Health

## 2012-12-19 ENCOUNTER — Encounter (HOSPITAL_COMMUNITY): Payer: Self-pay | Admitting: Behavioral Health

## 2012-12-19 DIAGNOSIS — F411 Generalized anxiety disorder: Secondary | ICD-10-CM

## 2012-12-19 NOTE — Progress Notes (Signed)
   THERAPIST PROGRESS NOTE  Session Time: 8:00  Participation Level: Active  Behavioral Response: CasualAlertAnxious  Type of Therapy: Individual Therapy  Treatment Goals addressed: Coping  Interventions: CBT  Summary: Jason [name] is a 47 y.o. male who presents with anxiety.   Suicidal/Homicidal: Nowithout intent/plan  Therapist Response: I was scheduled to meet with decline in his wife. He stated that she called him as he was coming into the building saying she was overweight would be late. She did not show up. He says he probably got sidetracked until she was okay. Client does express some frustration in relation to his relationship with his wife. He acknowledges that times he mostly shuts down because he is so tired at the end of the day when his wife wants to talk to him. He also says that he readily encourages his wife to do things with her friends saying that he will take care of the children, house etc. He is aware that encourage his wife to spend time with her sister-in-law and/or friends. Talked about things that he could do differently to improve his communication and reduce their frustration. He is aware of the fact that he uses humor as a defense mechanism and difficult or serious conversations. He is also aware that he is easily distractible talked about ways he could focus more on his wife doesn't want to have conversations with him. The client has difficulty with emotional conversation so we began to work on adding can do so when they're both in session together. We talked about how his wife's love language is physical touch. He is uncomfortable in public but states that he knows he can do a better job at home of simple things such as patting her knees, rubbing her back, how are hugging her. We also talked about how he could use reflections statements to let his wife know that he is paying attention. I told him he could use" so what I hear you say is" to make sure he does  understand what she is saying and reinforce the fact that he is listening. He laughed and said it would be awkward but he could say. I encouraged him to use his own words but for that to be the message. He did say that things are better with his younger daughter and he is made much more of an effort not to respond angrily. He does say he has had to speak to her recently about how disrespectful she is to his wife. He also indicated his oldest daughter will be moving out  To live somewhat. with her boyfriend so that will change family dynamics. The client does contract for safety saying he has no thoughts of hurting himself or anyone else. We also talked about trust issues. He continues to say he has never been unfaithful to his wife and offered to take a lie detector test for his wife. We will address that together with them in the next session.  Plan: Return again in 4 weeks.  Diagnosis: Axis I: 300.02    Axis II: Deferred    Dontavion Noxon M, LPC 12/19/2012

## 2013-01-17 ENCOUNTER — Encounter (HOSPITAL_COMMUNITY): Payer: Self-pay | Admitting: Behavioral Health

## 2013-01-17 ENCOUNTER — Ambulatory Visit (INDEPENDENT_AMBULATORY_CARE_PROVIDER_SITE_OTHER): Payer: 59 | Admitting: Behavioral Health

## 2013-01-17 DIAGNOSIS — F411 Generalized anxiety disorder: Secondary | ICD-10-CM
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
## 2013-02-07 ENCOUNTER — Ambulatory Visit (INDEPENDENT_AMBULATORY_CARE_PROVIDER_SITE_OTHER): Payer: 59 | Admitting: Behavioral Health

## 2013-02-07 ENCOUNTER — Encounter (HOSPITAL_COMMUNITY): Payer: Self-pay | Admitting: Behavioral Health

## 2013-02-07 DIAGNOSIS — F411 Generalized anxiety disorder: Secondary | ICD-10-CM

## 2013-02-07 DIAGNOSIS — F330 Major depressive disorder, recurrent, mild: Secondary | ICD-10-CM

## 2013-02-07 NOTE — Progress Notes (Signed)
   THERAPIST PROGRESS NOTE  Session Time: 8:00  Participation Level: Active  Behavioral Response: CasualAlertDepressed  Type of Therapy: Individual Therapy  Treatment Goals addressed: Coping  Interventions: CBT  Summary: Jason [name] is a 47 y.o. male who presents with depression and anxiety.   Suicidal/Homicidal: Nowithout intent/plan  Therapist Response: The client Indicated that he is tired and stressed out. He indicated that work is extremely busy he is putting in a significant extra hours. He indicates that physically and mentally he is exhausted when he comes home he reported multiple stressors at home. He states that his oldest daughter moved out she's been difficult for both he and his wife. He reported that the gentleman that they are renting their house from his now said that he is going to sell the house and they are going to have to find somewhere to move by the end of 2014. He reported that he knows he has been more irritable and has lost his temper several times his wife was not her fault. He states that he knows he is also on she makes references to him acting like his father. He indicates that he does apologize but knows that he needs to control his anger before he gets to that point. We talked about different ways for him to be more aware of anger and to stop before it escalates. We also process for ways that he could emotionally available to his wife such as physically turning his body toward her when they are talking, attempting to make more eye contact and eating using simple things to firm that he is listing such as nodding his head, saying understand him and saying I know this is difficult for you. He does express a certain discomfort with displays of affection with her being at home or in public. We talked about how that could be a part of his wife's trust issues now he could rebuild trust by stretching his comfort zone with small for such as putting his hand on her knee  rubbing her back etc. The client plans to attempt to make some of these changes knowing that it needs to be gradual. The client does contract for safety saying he is thoughts of hurting himself or anyone else  Plan: Return again in 4 weeks.  Diagnosis: Axis I: 296.31/300.02    Axis II: Deferred    French Ana, [facility] 02/07/2013

## 2013-02-22 ENCOUNTER — Ambulatory Visit (INDEPENDENT_AMBULATORY_CARE_PROVIDER_SITE_OTHER): Payer: 59 | Admitting: Behavioral Health

## 2013-02-22 ENCOUNTER — Encounter (HOSPITAL_COMMUNITY): Payer: Self-pay | Admitting: Behavioral Health

## 2013-02-22 DIAGNOSIS — F411 Generalized anxiety disorder: Secondary | ICD-10-CM

## 2013-02-22 NOTE — Progress Notes (Signed)
   THERAPIST PROGRESS NOTE  Session Time: 8:00  Participation Level: Active  Behavioral Response: CasualAlertAnxious  Type of Therapy: Individual Therapy  Treatment Goals addressed: Coping  Interventions: CBT  Summary: Jason [name] is a 47 y.o. male who presents with anxiety.   Suicidal/Homicidal: Nowithout intent/plan  Therapist Response: The client received a phone call as he was walking into the session from someone at work. He indicated that even though they're busy there hours have been adjusted and to call said that he would not be or to get 40 hours this week. The client was frustrated because he knows there is more than enough for her to do and scheduling is not being done correctly and was angry that he was possibly not going to get 40 hours. Reports increasing work frustration with one of the people he worked with because he is not doing all the work that he should be and the client struggles with the fact that his coworkers not being ethical.  The client reported that his most recent thoughts have been centered around his family. He stated that he had not spoken to his sister in the past year or 2 and have very little contact with her over the past 10 years. He indicated that he realized recently that he needed to speak with sister about why that had taken place. He revolves around the parents treatment of himself, his brother and his sister. He indicated that the conversations have been very positive and I openly with his sister and he is realized how much his parents treatment of all of them has had an effect on him throughout the years and has had an effect on how he treats his wife and children. He stated that his parents have guilt at him into doing things for them over the years and he is learning to separate himself from that guilt and balance how much time he commits to his parents. He also realizes that he needs to set healthy boundaries verbally and emotionally and how he  interacts with them. He did say his wife have been telling him this for years but he felt guilty of responsibility now realizes that his wife was seeing something he either was not seeing or did not want to see. The client does contract for safety. He did say he will continue to work on setting healthy boundaries with his parents and recognizes this is a process.  Plan: Return again in 3 weeks.  Diagnosis: Axis I: 300.02    Axis II: Deferred    Dosia Yodice M, [facility] 02/22/2013

## 2013-03-13 ENCOUNTER — Encounter (HOSPITAL_COMMUNITY): Payer: Self-pay | Admitting: Behavioral Health

## 2013-03-13 ENCOUNTER — Ambulatory Visit (INDEPENDENT_AMBULATORY_CARE_PROVIDER_SITE_OTHER): Payer: 59 | Admitting: Behavioral Health

## 2013-03-13 DIAGNOSIS — F411 Generalized anxiety disorder: Secondary | ICD-10-CM

## 2013-03-13 NOTE — Progress Notes (Signed)
   THERAPIST PROGRESS NOTE  Session Time: 8:00  Participation Level: Active  Behavioral Response: CasualAlertAnxious/tired  Type of Therapy: Family Therapy  Treatment Goals addressed: Coping  Interventions: CBT  Summary: PLATO ALSPAUGH is a 47 y.o. male who presents with anxiety.   Suicidal/Homicidal: Nowithout intent/plan  Therapist Response: I met with the client and his wife for the entire session. Each has multiple stressors. The clients work has been very busy and difficult and his father has significant health issues. The clients wife is balancing work and school as well as ongoing health issues. Both report that their daughters are doing relatively well currently.  We focused on improved communication between the 2. Talked about responsibility and respect in terms of how, when, and types of information that they communicate. We talked about the importance of each of them having some down time. For the client that is hunting. 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 He is talked about the importance of anxiety reduction for both of them and how that will improve the relationship. We did review anxiety reduction techniques and I encouraged him to listen to the CD on relaxation.  Plan: Return again in 2 weeks.  Diagnosis: Axis I: 300.02    Axis II: Deferred    French Ana, [facility] 03/13/2013

## 2013-03-27 ENCOUNTER — Ambulatory Visit (HOSPITAL_COMMUNITY): Payer: Self-pay | Admitting: Behavioral Health

## 2013-05-17 ENCOUNTER — Encounter (HOSPITAL_COMMUNITY): Payer: Self-pay | Admitting: Behavioral Health

## 2013-05-17 ENCOUNTER — Ambulatory Visit (INDEPENDENT_AMBULATORY_CARE_PROVIDER_SITE_OTHER): Payer: 59 | Admitting: Behavioral Health

## 2013-05-17 DIAGNOSIS — F411 Generalized anxiety disorder: Secondary | ICD-10-CM

## 2013-05-17 NOTE — Progress Notes (Signed)
   THERAPIST PROGRESS NOTE  Session Time: 8:00  Participation Level: Active  Behavioral Response: CasualAlertAnxious  Type of Therapy: Family Therapy  Treatment Goals addressed: Coping  Interventions: CBT  Summary: Jason [name] is a 47 y.o. male who presents with anxiety and family problems.   Suicidal/Homicidal: Nowithout intent/plan  Therapist Response: I met with the client and his wife to continue to work on improved communication. There certainly are differences in perception in relation to various things including subject matter, tone of voice, cues in conversation, volume, body language. Both also bring family history is in 2 relationship which have been harmful. I renewed her request for them to meet once a week as opposed to at the end of the week and we'll both or tired from work and school. We talked about the use of language not using profanity, staying calm and using I feel statements for 30 minutes per week. Both client and his wife contract for safety.  Plan: Return again in 4 weeks.  Diagnosis: Axis I: 300.02    Axis II: Deferred    Lela Murfin M, LPC 05/17/2013

## 2013-06-12 ENCOUNTER — Encounter (HOSPITAL_COMMUNITY): Payer: Self-pay | Admitting: Behavioral Health

## 2013-06-12 ENCOUNTER — Ambulatory Visit (INDEPENDENT_AMBULATORY_CARE_PROVIDER_SITE_OTHER): Payer: 59 | Admitting: Behavioral Health

## 2013-06-12 DIAGNOSIS — F411 Generalized anxiety disorder: Secondary | ICD-10-CM

## 2013-06-12 NOTE — Progress Notes (Addendum)
   THERAPIST PROGRESS NOTE  Session Time: 8:00  Participation Level: Active  Behavioral Response: CasualAlertAnxious  Type of Therapy: Individual Therapy  Treatment Goals addressed: Coping  Interventions: CBT  Summary: JAYLYNN SIEFERT is a 47 y.o. male who presents with anxiety.   Suicidal/Homicidal: Nowithout intent/plan  Therapist Response: The client indicated that he had been on occasion a week but was still tired. He reported that he is not sleeping well part of the being anxiety and stress related to family dynamics. We explored his relationship with his wife and the attempt to improve communication that we have talked about in the past. I did agree to refer him to a marital therapist which she said both he and his wife have agreed to. The client expressed frustrations in the attempts that he made to improve things. He indicated some concern about his wife's physical health issues as well as the stress she was under and how that may be playing into the relationship. He talked about the aspects of his relationship that he could control and including his reactions to difficult or challenging situations or conversations. He does contract for safety saying he has no thoughts or hurting himself or anyone else.  Plan: Return again in 4 weeks.  Diagnosis: Axis I: 300.02    Axis II: Deferred    French Ana, [facility] 06/12/2013

## 2013-07-15 ENCOUNTER — Encounter: Payer: Self-pay | Admitting: Family Medicine

## 2013-07-15 ENCOUNTER — Ambulatory Visit: Payer: Self-pay | Admitting: Family Medicine

## 2013-07-15 ENCOUNTER — Ambulatory Visit (INDEPENDENT_AMBULATORY_CARE_PROVIDER_SITE_OTHER): Payer: BC Managed Care – PPO | Admitting: Family Medicine

## 2013-07-15 VITALS — BP 121/82 | HR 91 | Temp 98.1°F | Wt 156.0 lb

## 2013-07-15 DIAGNOSIS — A499 Bacterial infection, unspecified: Secondary | ICD-10-CM

## 2013-07-15 DIAGNOSIS — J329 Chronic sinusitis, unspecified: Secondary | ICD-10-CM

## 2013-07-15 DIAGNOSIS — B9689 Other specified bacterial agents as the cause of diseases classified elsewhere: Secondary | ICD-10-CM

## 2013-07-15 MED ORDER — DOXYCYCLINE HYCLATE 100 MG PO TABS: ORAL_TABLET | ORAL | Status: AC

## 2013-07-15 NOTE — Progress Notes (Signed)
CC: Jason [name] is a 47 y.o. male is here for Cough   Subjective: HPI:  Complains of facial pressure localized in the forehead accompanied by thick nasal discharge that has been present for the last 4 days worsening on a daily basis. Symptoms are worse in the evenings and in the mornings also when leaning forward. No interventions as of yet. Fatigue but no fevers or chills. Reports nonproductive cough that has been also present for 4 days.mild in severity . symptoms are present all hours of the day.   denies shortness of breath, wheezing, nor chest pain   Review Of Systems O Complains ofutlined In HPI  Past Medical History  Diagnosis Date  . Anxiety      Family History  Problem Relation Age of Onset  . Bipolar disorder Father   . Heart attack Father 4    pacemaker, smoker  . Alcohol abuse Paternal Uncle   . Hypertension    . Alcohol abuse       History  Substance Use Topics  . Smoking status: Current Every Day Smoker -- 1.00 packs/day    Types: Cigarettes  . Smokeless tobacco: Never Used  . Alcohol Use: 0.0 oz/week    0 Cans of beer per week     Comment: drinks maybe once a month.       Objective: Filed Vitals:   07/15/13 1144  BP: 121/82  Pulse: 91  Temp: 98.1 F (36.7 C)    General: Alert and Oriented, No Acute Distress HEENT: Pupils equal, round, reactive to light. Conjunctivae clear.  External ears unremarkable, canals clear with intact TMs with appropriate landmarks.   left middle ear unremarkable right middle ear with serous effusion .  boggy erythematous inferior turbinates with moderate mucoid discharge.  Moist mucous membranes, pharynx without inflammation nor lesions.  Neck supple without palpable lymphadenopathy nor abnormal masses. frontal sinus tenderness to percussion bilaterally Lungs:  comfortable work of breathing with trace end expiratory wheezing in left lung fields. No signs of consolidation no rhonchi or rales Mental Status: No depression,  anxiety, nor agitation. Skin: Warm and dry.  Assessment & Plan: Jason [name] was seen today for cough.  Diagnoses and associated orders for this visit:  Bacterial sinusitis - doxycycline (VIBRA-TABS) 100 MG tablet; One by mouth twice a day for ten days.     bacterial sinusitis: Start alkaselzer cold and Sinus on an as-needed basis likely viral etiology at this point. Also consider nasal saline washes. If Symptoms persist beyond 7-10 days strongly consider starting doxycycline   Return if symptoms worsen or fail to improve.

## 2013-07-26 ENCOUNTER — Ambulatory Visit (HOSPITAL_COMMUNITY): Payer: Self-pay | Admitting: Behavioral Health

## 2013-09-17 ENCOUNTER — Encounter (HOSPITAL_COMMUNITY): Payer: Self-pay | Admitting: Behavioral Health

## 2014-10-07 ENCOUNTER — Telehealth: Payer: Self-pay | Admitting: Family Medicine

## 2014-10-07 NOTE — Telephone Encounter (Signed)
Ok, changed to [facility]

## 2014-10-07 NOTE — Telephone Encounter (Signed)
Ok with me 

## 2014-10-07 NOTE — Telephone Encounter (Signed)
Patient called request to know if he can change providers from Dr. Linford ArnoldMetheney to Dr. Ivan AnchorsHommel. Pt has seen Dr. Linford ArnoldMetheney one time and is just requesting a male doctor. Thanks

## 2014-10-15 ENCOUNTER — Ambulatory Visit (INDEPENDENT_AMBULATORY_CARE_PROVIDER_SITE_OTHER): Payer: BLUE CROSS/BLUE SHIELD | Admitting: Family Medicine

## 2014-10-15 ENCOUNTER — Encounter: Payer: Self-pay | Admitting: Family Medicine

## 2014-10-15 VITALS — BP 113/75 | HR 79 | Ht 67.0 in | Wt 160.0 lb

## 2014-10-15 DIAGNOSIS — N400 Enlarged prostate without lower urinary tract symptoms: Secondary | ICD-10-CM

## 2014-10-15 DIAGNOSIS — Z0000 Encounter for general adult medical examination without abnormal findings: Secondary | ICD-10-CM | POA: Diagnosis not present

## 2014-10-15 HISTORY — DX: Benign prostatic hyperplasia without lower urinary tract symptoms: N40.0

## 2014-10-15 LAB — COMPLETE METABOLIC PANEL WITH GFR
ALT: 15 U/L (ref 0–53)
AST: 20 U/L (ref 0–37)
Albumin: 4.2 g/dL (ref 3.5–5.2)
Alkaline Phosphatase: 55 U/L (ref 39–117)
BUN: 15 mg/dL (ref 6–23)
CO2: 29 mEq/L (ref 19–32)
Calcium: 9.1 mg/dL (ref 8.4–10.5)
Chloride: 103 mEq/L (ref 96–112)
Creat: 1.06 mg/dL (ref 0.50–1.35)
GFR, Est African American: >89 mL/min
GFR, Est Non African American: 82 mL/min
Glucose, Bld: 89 mg/dL (ref 70–99)
Potassium: 4.2 mEq/L (ref 3.5–5.3)
Sodium: 139 mEq/L (ref 135–145)
Total Bilirubin: 0.8 mg/dL (ref 0.2–1.2)
Total Protein: 6.7 g/dL (ref 6.0–8.3)

## 2014-10-15 LAB — CBC
HCT: 48.5 % (ref 39.0–52.0)
Hemoglobin: 16.3 g/dL (ref 13.0–17.0)
MCH: 29.2 pg (ref 26.0–34.0)
MCHC: 33.6 g/dL (ref 30.0–36.0)
MCV: 86.8 fL (ref 78.0–100.0)
MPV: 11.6 fL (ref 8.6–12.4)
Platelets: 151 10*3/uL (ref 150–400)
RBC: 5.59 MIL/uL (ref 4.22–5.81)
RDW: 13.4 % (ref 11.5–15.5)
WBC: 5.6 10*3/uL (ref 4.0–10.5)

## 2014-10-15 LAB — LIPID PANEL
Cholesterol: 198 mg/dL (ref 0–200)
HDL: 45 mg/dL (ref >=40)
LDL Cholesterol: 139 mg/dL — ABNORMAL HIGH (ref 0–99)
Total CHOL/HDL Ratio: 4.4 Ratio
Triglycerides: 68 mg/dL (ref <150)
VLDL: 14 mg/dL (ref 0–40)

## 2014-10-15 NOTE — Patient Instructions (Signed)
Dr. Shanika Levings's General Advice Following Your Complete Physical Exam  The Benefits of Regular Exercise: Unless you suffer from an uncontrolled cardiovascular condition, studies strongly suggest that regular exercise and physical activity will add to both the quality and length of your life.  The World Health Organization recommends 150 minutes of moderate intensity aerobic activity every week.  This is best split over 3-4 days a week, and can be as simple as a brisk walk for just over 35 minutes "most days of the week".  This type of exercise has been shown to lower LDL-Cholesterol, lower average blood sugars, lower blood pressure, lower cardiovascular disease risk, improve memory, and increase one's overall sense of wellbeing.  The addition of anaerobic (or "strength training") exercises offers additional benefits including but not limited to increased metabolism, prevention of osteoporosis, and improved overall cholesterol levels.  How Can I Strive For A Low-Fat Diet?: Current guidelines recommend that 25-35 percent of your daily energy (food) intake should come from fats.  One might ask how can this be achieved without having to dissect each meal on a daily basis?  Switch to skim or 1% milk instead of whole milk.  Focus on lean meats such as ground turkey, fresh fish, baked chicken, and lean cuts of beef as your source of dietary protein.  Limit saturated fat consumption to less than 10% of your daily caloric intake.  Limit trans fatty acid consumption primarily by limiting synthetic trans fats such as partially hydrogenated oils (Ex: fried fast foods).  Substitute olive or vegetable oil for solid fats where possible.  Moderation of Salt Intake: Provided you don't carry a diagnosis of congestive heart failure nor renal failure, I recommend a daily allowance of no more than 2300 mg of salt (sodium).  Keeping under this daily goal is associated with a decreased risk of cardiovascular events, creeping  above it can lead to elevated blood pressures and increases your risk of cardiovascular events.  Milligrams (mg) of salt is listed on all nutrition labels, and your daily intake can add up faster than you think.  Most canned and frozen dinners can pack in over half your daily salt allowance in one meal.    Lifestyle Health Risks: Certain lifestyle choices carry specific health risks.  As you may already know, tobacco use has been associated with increasing one's risk of cardiovascular disease, pulmonary disease, numerous cancers, among many other issues.  What you may not know is that there are medications and nicotine replacement strategies that can more than double your chances of successfully quitting.  I would be thrilled to help manage your quitting strategy if you currently use tobacco products.  When it comes to alcohol use, I've yet to find an "ideal" daily allowance.  Provided an individual does not have a medical condition that is exacerbated by alcohol consumption, general guidelines determine "safe drinking" as no more than two standard drinks for a man or no more than one standard drink for a male per day.  However, much debate still exists on whether any amount of alcohol consumption is technically "safe".  My general advice, keep alcohol consumption to a minimum for general health promotion.  If you or others believe that alcohol, tobacco, or recreational drug use is interfering with your life, I would be happy to provide confidential counseling regarding treatment options.  General "Over The Counter" Nutrition Advice: Postmenopausal women should aim for a daily calcium intake of 1200 mg, however a significant portion of this might already be   provided by diets including milk, yogurt, cheese, and other dairy products.  Vitamin D has been shown to help preserve bone density, prevent fatigue, and has even been shown to help reduce falls in the elderly.  Ensuring a daily intake of 800 Units of  Vitamin D is a good place to start to enjoy the above benefits, we can easily check your Vitamin D level to see if you'd potentially benefit from supplementation beyond 800 Units a day.  Folic Acid intake should be of particular concern to women of childbearing age.  Daily consumption of 400-800 mcg of Folic Acid is recommended to minimize the chance of spinal cord defects in a fetus should pregnancy occur.    For many adults, accidents still remain one of the most common culprits when it comes to cause of death.  Some of the simplest but most effective preventitive habits you can adopt include regular seatbelt use, proper helmet use, securing firearms, and regularly testing your smoke and carbon monoxide detectors.  Gracin Mcpartland B. Koden Hunzeker DO [facility] [address], [address] [place], [place] [address] Phone: [phone]  

## 2014-10-15 NOTE — Progress Notes (Signed)
CC: Jason [name] is a 49 y.o. male is here for Annual Exam   Subjective: HPI:  Colonoscopy: No current indication Prostate: Discussed screening risks/beneifts with patient today, he has been experiencing having to wake up about 2 times a night to urinate. He's also had what he considers a weak stream. No family history of prostate cancer but he is worried he could have cancer. He would like to have a PSA which seems reasonable.  Influenza Vaccine: No current indication Pneumovax: No current indication Td/Tdap: UTD Zoster: (Start 50 yo)  Requesting complete physical exam   Review of Systems - General ROS: negative for - chills, fever, night sweats, weight gain or weight loss Ophthalmic ROS: negative for - decreased vision Psychological ROS: negative for - anxiety or depression ENT ROS: negative for - hearing change, nasal congestion, tinnitus or allergies Hematological and Lymphatic ROS: negative for - bleeding problems, bruising or swollen lymph nodes Breast ROS: negative Respiratory ROS: no cough, shortness of breath, or wheezing Cardiovascular ROS: no chest pain or dyspnea on exertion Gastrointestinal ROS: no abdominal pain, change in bowel habits, or black or bloody stools Genito-Urinary ROS: negative for - genital discharge, genital ulcers, incontinence or abnormal bleeding from genitals Musculoskeletal ROS: negative for - joint pain or muscle pain Neurological ROS: negative for - headaches or memory loss Dermatological ROS: negative for lumps, mole changes, rash and skin lesion changes  Past Medical History  Diagnosis Date  . Anxiety     No past surgical history on file. Family History  Problem Relation Age of Onset  . Bipolar disorder Father   . Heart attack Father 2650    pacemaker, smoker  . Alcohol abuse Paternal Uncle   . Hypertension    . Alcohol abuse      History   Social History  . Marital Status: Married    Spouse Name: Wynona CanesChristine  . Number of  Children: 2  . Years of Education: HS   Occupational History  . Radio broadcast assistantGranite installer.      [address]   Social History Main Topics  . Smoking status: Current Every Day Smoker -- 1.00 packs/day    Types: Cigarettes  . Smokeless tobacco: Never Used  . Alcohol Use: 0.0 oz/week    0 Cans of beer per week     Comment: drinks maybe once a month.    . Drug Use: No  . Sexual Activity: Yes   Other Topics Concern  . Not on file   Social History Narrative   No regular exercise but physical job.  1 pot of coffee a day.      Objective: BP 113/75 mmHg  Pulse 79  Ht 5\' 7"  (1.702 m)  Wt 160 lb (72.576 kg)  BMI 25.05 kg/m2  General: No Acute Distress HEENT: Atraumatic, normocephalic, conjunctivae normal without scleral icterus.  No nasal discharge, hearing grossly intact, TMs with good landmarks bilaterally with no middle ear abnormalities, posterior pharynx clear without oral lesions. Neck: Supple, trachea midline, no cervical nor supraclavicular adenopathy. Pulmonary: Clear to auscultation bilaterally without wheezing, rhonchi, nor rales. Cardiac: Regular rate and rhythm.  No murmurs, rubs, nor gallops. No peripheral edema.  2+ peripheral pulses bilaterally. Abdomen: Bowel sounds normal.  No masses.  Non-tender without rebound.  Negative Murphy's sign. GU: Bilateral descended testes. Mild hypertrophy of the palpable prostate with smooth surface and no nodularity. No tenderness to the prostate. MSK: Grossly intact, no signs of weakness.  Full strength throughout upper and lower extremities.  Full ROM in upper and lower extremities.  No midline spinal tenderness. Neuro: Gait unremarkable, CN II-XII grossly intact.  C5-C6 Reflex 2/4 Bilaterally, L4 Reflex 2/4 Bilaterally.  Cerebellar function intact. Skin: No rashes. Psych: Alert and oriented to person/place/time.  Thought process normal. No anxiety/depression.  Assessment & Plan: Jason [name] was seen today for annual exam.  Diagnoses and all  orders for this visit:  Annual physical exam Orders: -     Lipid panel -     COMPLETE METABOLIC PANEL WITH GFR -     CBC  BPH (benign prostatic hyperplasia)   Healthy lifestyle interventions including but not limited to regular exercise, a healthy low fat diet, moderation of salt intake, the dangers of tobacco/alcohol/recreational drug use, nutrition supplementation, and accident avoidance were discussed with the patient and a handout was provided for future reference.  If PSA is normal will offer him Flomax for BPH.   Return if symptoms worsen or fail to improve.

## 2014-10-16 ENCOUNTER — Telehealth: Payer: Self-pay | Admitting: Family Medicine

## 2014-10-16 ENCOUNTER — Encounter: Payer: Self-pay | Admitting: Family Medicine

## 2014-10-16 DIAGNOSIS — Z125 Encounter for screening for malignant neoplasm of prostate: Secondary | ICD-10-CM

## 2014-10-16 DIAGNOSIS — Z0000 Encounter for general adult medical examination without abnormal findings: Secondary | ICD-10-CM

## 2014-10-16 DIAGNOSIS — E785 Hyperlipidemia, unspecified: Secondary | ICD-10-CM | POA: Insufficient documentation

## 2014-10-16 HISTORY — DX: Hyperlipidemia, unspecified: E78.5

## 2014-10-16 NOTE — Telephone Encounter (Signed)
Sue Lushndrea, Will you please let patient know that i had intended for him to have a PSA test run but this wasn't ordered with his labs yesterday.  I'd still recommend he have this checked, is the lab able to add this on? If not a new lab slip is in your inbox.    His cholesterol was mildly elevated however not to a degree that warrants cholesterol lowering medication given his age and lack of cardiac risk factors.  This may change over time as he gets older, for now This can be improved with engaging in 30-45 minutes of moderate exercise most days of the week. Blood sugar, kidney function, liver function and blood cell counts were all normal.

## 2014-10-16 NOTE — Telephone Encounter (Signed)
Tried calling patient  & was informed that this is a incorrect # Called [phone].Marland Kitchen.Direce

## 2015-01-26 ENCOUNTER — Telehealth: Payer: Self-pay | Admitting: Family Medicine

## 2015-01-26 NOTE — Telephone Encounter (Signed)
Received fax from call center stating Pt's wife attempted to contact clinic regarding her Husband, Jason [name], and a tick bite.   Spoke with Pt's wife, pt is complaining of: redness, irritation, pain, blisters, chills, and fatigue. Advised wife to take Pt to UC today and we would be happy to follow up with this after he is treated. Verbalized she will take him to UC today. No further questions.

## 2016-02-03 DIAGNOSIS — D239 Other benign neoplasm of skin, unspecified: Secondary | ICD-10-CM | POA: Diagnosis not present

## 2016-02-03 DIAGNOSIS — L814 Other melanin hyperpigmentation: Secondary | ICD-10-CM | POA: Diagnosis not present

## 2016-02-03 DIAGNOSIS — L821 Other seborrheic keratosis: Secondary | ICD-10-CM | POA: Diagnosis not present

## 2016-05-23 ENCOUNTER — Telehealth: Payer: Self-pay | Admitting: Family Medicine

## 2016-05-23 NOTE — Telephone Encounter (Signed)
Sounds fine. Please schedule. Thanks for asking.

## 2016-05-23 NOTE — Telephone Encounter (Signed)
Pt is a previous hommel pt and would like to establish with you because he would like a male provider. Would this be ok . Thanks

## 2016-05-25 ENCOUNTER — Ambulatory Visit (INDEPENDENT_AMBULATORY_CARE_PROVIDER_SITE_OTHER): Payer: BLUE CROSS/BLUE SHIELD | Admitting: Family Medicine

## 2016-05-25 ENCOUNTER — Encounter: Payer: Self-pay | Admitting: Family Medicine

## 2016-05-25 VITALS — BP 135/72 | HR 83 | Wt 168.0 lb

## 2016-05-25 DIAGNOSIS — E782 Mixed hyperlipidemia: Secondary | ICD-10-CM

## 2016-05-25 DIAGNOSIS — Z720 Tobacco use: Secondary | ICD-10-CM | POA: Diagnosis not present

## 2016-05-25 DIAGNOSIS — Z1211 Encounter for screening for malignant neoplasm of colon: Secondary | ICD-10-CM

## 2016-05-25 DIAGNOSIS — N401 Enlarged prostate with lower urinary tract symptoms: Secondary | ICD-10-CM | POA: Diagnosis not present

## 2016-05-25 DIAGNOSIS — R351 Nocturia: Secondary | ICD-10-CM | POA: Diagnosis not present

## 2016-05-25 DIAGNOSIS — Z0000 Encounter for general adult medical examination without abnormal findings: Secondary | ICD-10-CM

## 2016-05-25 DIAGNOSIS — Z114 Encounter for screening for human immunodeficiency virus [HIV]: Secondary | ICD-10-CM

## 2016-05-25 LAB — COMPLETE METABOLIC PANEL WITH GFR
ALBUMIN: 4.3 g/dL (ref 3.6–5.1)
ALT: 14 U/L (ref 9–46)
AST: 16 U/L (ref 10–35)
Alkaline Phosphatase: 50 U/L (ref 40–115)
BILIRUBIN TOTAL: 0.6 mg/dL (ref 0.2–1.2)
BUN: 12 mg/dL (ref 7–25)
CO2: 24 mmol/L (ref 20–31)
Calcium: 8.9 mg/dL (ref 8.6–10.3)
Chloride: 105 mmol/L (ref 98–110)
Creat: 1.06 mg/dL (ref 0.70–1.33)
GFR, EST NON AFRICAN AMERICAN: 81 mL/min (ref >=60)
GFR, Est African American: >89 mL/min (ref >=60)
Glucose, Bld: 94 mg/dL (ref 65–99)
POTASSIUM: 4.2 mmol/L (ref 3.5–5.3)
Sodium: 139 mmol/L (ref 135–146)
Total Protein: 6.1 g/dL (ref 6.1–8.1)

## 2016-05-25 LAB — CBC
HCT: 48.9 % (ref 38.5–50.0)
Hemoglobin: 16.7 g/dL (ref 13.2–17.1)
MCH: 29.8 pg (ref 27.0–33.0)
MCHC: 34.2 g/dL (ref 32.0–36.0)
MCV: 87.3 fL (ref 80.0–100.0)
MPV: 11.7 fL (ref 7.5–12.5)
PLATELETS: 142 10*3/uL (ref 140–400)
RBC: 5.6 MIL/uL (ref 4.20–5.80)
RDW: 13.4 % (ref 11.0–15.0)
WBC: 6.5 10*3/uL (ref 3.8–10.8)

## 2016-05-25 LAB — LIPID PANEL
CHOLESTEROL: 181 mg/dL (ref <200)
HDL: 45 mg/dL (ref >40)
LDL Cholesterol: 122 mg/dL — ABNORMAL HIGH
TRIGLYCERIDES: 72 mg/dL (ref <150)
Total CHOL/HDL Ratio: 4 Ratio (ref <5.0)
VLDL: 14 mg/dL (ref <30)

## 2016-05-25 LAB — HEMOGLOBIN A1C
Hgb A1c MFr Bld: 5.2 % (ref <5.7)
Mean Plasma Glucose: 103 mg/dL

## 2016-05-25 LAB — PSA: PSA: 0.4 ng/mL (ref <=4.0)

## 2016-05-25 MED ORDER — VARENICLINE TARTRATE 0.5 MG X 11 & 1 MG X 42 PO MISC: ORAL | 0 refills | Status: DC

## 2016-05-25 MED ORDER — VARENICLINE TARTRATE 1 MG PO TABS: 1.0000 mg | ORAL_TABLET | Freq: Two times a day (BID) | ORAL | 3 refills | Status: DC

## 2016-05-25 MED ORDER — TAMSULOSIN HCL 0.4 MG PO CAPS: 0.4000 mg | ORAL_CAPSULE | Freq: Every day | ORAL | 3 refills | Status: DC

## 2016-05-25 NOTE — Progress Notes (Signed)
Araceli BoucheWalter T Hannis is a 50 y.o. male who [facility] today for well adult.  Patient is doing well with only urinary symptoms as his active medical problem. Additionally he smokes. He is active and tries to maintain a healthy lifestyle. He is very interested in smoking cessation. He's had trouble quitting in the past. He feels very motivated to quit however. As for his urinary symptoms he notes bothersome nocturia and is unhappy with his quality of life. He's never been on medications.  AUA symptoms score 11/35 Quality of life score 5/6  Past Medical History:  Diagnosis Date  . Anxiety   . BPH (benign prostatic hyperplasia) 10/15/2014  . Hyperlipidemia 10/16/2014   AHA 10 year risk of 2.8% March 2016    No past surgical history on file. Social History  Substance Use Topics  . Smoking status: Current Every Day Smoker    Packs/day: 1.00    Types: Cigarettes  . Smokeless tobacco: Never Used  . Alcohol use 0.0 oz/week     Comment: drinks maybe once a month.     family history includes Alcohol abuse in his paternal uncle; Bipolar disorder in his father; Heart attack (age of onset: 1850) in his father.  ROS as above:  Medications: Current Outpatient Prescriptions  Medication Sig Dispense Refill  . tamsulosin (FLOMAX) 0.4 MG CAPS capsule Take 1 capsule (0.4 mg total) by mouth daily. 90 capsule 3  . varenicline (CHANTIX STARTING MONTH PAK) 0.5 MG X 11 & 1 MG X 42 tablet Take one 0.5mg  tablet by mouth once daily for 3 days, then increase to one 0.5mg  tablet twice daily for 3 days, then increase to one 1mg  tablet twice daily. 53 tablet 0  . varenicline (CHANTIX) 1 MG tablet Take 1 tablet (1 mg total) by mouth 2 (two) times daily. 60 tablet 3   No current facility-administered medications for this visit.    Allergies  Allergen Reactions  . Cholestatin     Sinus  and head pressure    Health Maintenance Health Maintenance  Topic Date Due  . HIV Screening  06/07/1980  . COLONOSCOPY  06/08/2015  . INFLUENZA VACCINE  05/25/2017 (Originally 02/16/2016)  . TETANUS/TDAP  11/07/2021     Exam:  BP 135/72   Pulse 83   Wt 168 lb (76.2 kg)   SpO2 100%   BMI 26.31 kg/m  Gen: Well NAD Nontoxic appearing HEENT: EOMI,  MMM Lungs: Normal work of breathing. CTABL Heart: RRR no MRG Abd: NABS, Soft. Nondistended, Nontender Exts: Brisk capillary refill, warm and well perfused.  Rectal: Anus with small external hemorrhoids. Prostate is enlarged without nodules. Nontender.    No results found for this or any previous visit (from the past 72 hour(s)). No results found.    Assessment and Plan: 50 y.o. male with  Well adult. Doing well. We'll assess risk factors by reobtaining fasting lipids. Patient's risk factor with smoking is likely high enough to consider statins. Patient wishes to work on smoking cessation instead.  BPH: Very likely. We'll recheck PSA and start Flomax.  Smoking cessation: We spent 10 minutes discussing smoking cessation options. Plan for Chantix and recheck in the near future.  Health maintenance: Refer for colonoscopy. Patient declines flu vaccine.  Orders Placed This Encounter  Procedures  . CBC  . COMPLETE METABOLIC PANEL WITH GFR  . Hemoglobin A1c  . Lipid panel  . PSA  . HIV antibody  .  Ambulatory referral to Gastroenterology    Referral Priority:   Routine    Referral Type:   Consultation    Referral Reason:   Specialty Services Required    Requested Specialty:   Gastroenterology    Number of Visits Requested:   1    Discussed warning signs or symptoms. Please see discharge instructions. Patient expresses understanding.

## 2016-05-25 NOTE — Patient Instructions (Signed)
Thank you for coming in today.  Start Flomax daily. Start Chantix starting pack. Continue for 3 months. I recommend colonoscopy.   Tamsulosin capsules What is this medicine? TAMSULOSIN (tam SOO loe sin) is used to treat enlargement of the prostate gland in men, a condition called benign prostatic hyperplasia or BPH. It is not for use in women. It works by relaxing muscles in the prostate and bladder neck. This improves urine flow and reduces BPH symptoms. This medicine may be used for other purposes; ask your health care provider or pharmacist if you have questions. What should I tell my health care provider before I take this medicine? They need to know if you have any of the following conditions: -advanced kidney disease -advanced liver disease -low blood pressure -prostate cancer -an unusual or allergic reaction to tamsulosin, sulfa drugs, other medicines, foods, dyes, or preservatives -pregnant or trying to get pregnant -breast-feeding How should I use this medicine? Take this medicine by mouth about 30 minutes after the same meal every day. Follow the directions on the prescription label. Swallow the capsules whole with a glass of water. Do not crush, chew, or open capsules. Do not take your medicine more often than directed. Do not stop taking your medicine unless your doctor tells you to. Talk to your pediatrician regarding the use of this medicine in children. Special care may be needed. Overdosage: If you think you have taken too much of this medicine contact a poison control center or emergency room at once. NOTE: This medicine is only for you. Do not share this medicine with others. What if I miss a dose? If you miss a dose, take it as soon as you can. If it is almost time for your next dose, take only that dose. Do not take double or extra doses. If you stop taking your medicine for several days or more, ask your doctor or health care professional what dose you should start back  on. What may interact with this medicine? -cimetidine -fluoxetine -ketoconazole -medicines for erectile disfunction like sildenafil, tadalafil, vardenafil -medicines for high blood pressure -other alpha-blockers like alfuzosin, doxazosin, phentolamine, phenoxybenzamine, prazosin, terazosin -warfarin This list may not describe all possible interactions. Give your health care provider a list of all the medicines, herbs, non-prescription drugs, or dietary supplements you use. Also tell them if you smoke, drink alcohol, or use illegal drugs. Some items may interact with your medicine. What should I watch for while using this medicine? Visit your doctor or health care professional for regular check ups. You will need lab work done before you start this medicine and regularly while you are taking it. Check your blood pressure as directed. Ask your health care professional what your blood pressure should be, and when you should contact him or her. This medicine may make you feel dizzy or lightheaded. This is more likely to happen after the first dose, after an increase in dose, or during hot weather or exercise. Drinking alcohol and taking some medicines can make this worse. Do not drive, use machinery, or do anything that needs mental alertness until you know how this medicine affects you. Do not sit or stand up quickly. If you begin to feel dizzy, sit down until you feel better. These effects can decrease once your body adjusts to the medicine. Contact your doctor or health care professional right away if you have an erection that lasts longer than 4 hours or if it becomes painful. This may be a sign of a  serious problem and must be treated right away to prevent permanent damage. If you are thinking of having cataract surgery, tell your eye surgeon that you have taken this medicine. What side effects may I notice from receiving this medicine? Side effects that you should report to your doctor or health  care professional as soon as possible: -allergic reactions like skin rash or itching, hives, swelling of the lips, mouth, tongue, or throat -breathing problems -change in vision -feeling faint or lightheaded -irregular heartbeat -prolonged or painful erection -weakness Side effects that usually do not require medical attention (report to your doctor or health care professional if they continue or are bothersome): -back pain -change in sex drive or performance -constipation, nausea or vomiting -cough -drowsy -runny or stuffy nose -trouble sleeping This list may not describe all possible side effects. Call your doctor for medical advice about side effects. You may report side effects to FDA at 1-800-FDA-1088. Where should I keep my medicine? Keep out of the reach of children. Store at room temperature between 15 and 30 degrees C (59 and 86 degrees F). Throw away any unused medicine after the expiration date. NOTE: This sheet is a summary. It may not cover all possible information. If you have questions about this medicine, talk to your doctor, pharmacist, or health care provider.    2016, Elsevier/Gold Standard. (2012-07-04 14:11:34)  Varenicline oral tablets What is this medicine? VARENICLINE (var EN i kleen) is used to help people quit smoking. It can reduce the symptoms caused by stopping smoking. It is used with a patient support program recommended by your physician. This medicine may be used for other purposes; ask your health care provider or pharmacist if you have questions. What should I tell my health care provider before I take this medicine? They need to know if you have any of these conditions: -bipolar disorder, depression, schizophrenia or other mental illness -heart disease -if you often drink alcohol -kidney disease -peripheral vascular disease -seizures -stroke -suicidal thoughts, plans, or attempt; a previous suicide attempt by you or a family member -an  unusual or allergic reaction to varenicline, other medicines, foods, dyes, or preservatives -pregnant or trying to get pregnant -breast-feeding How should I use this medicine? Take this medicine by mouth after eating. Take with a full glass of water. Follow the directions on the prescription label. Take your doses at regular intervals. Do not take your medicine more often than directed. There are 3 ways you can use this medicine to help you quit smoking; talk to your health care professional to decide which plan is right for you: 1) you can choose a quit date and start this medicine 1 week before the quit date, or, 2) you can start taking this medicine before you choose a quit date, and then pick a quit date between day 8 and 35 days of treatment, or, 3) if you are not sure that you are able or willing to quit smoking right away, start taking this medicine and slowly decrease the amount you smoke as directed by your health care professional with the goal of being cigarette-free by week 12 of treatment. Stick to your plan; ask about support groups or other ways to help you remain cigarette-free. If you are motivated to quit smoking and did not succeed during a previous attempt with this medicine for reasons other than side effects, or if you returned to smoking after this treatment, speak with your health care professional about whether another course of this medicine  may be right for you. A special MedGuide will be given to you by the pharmacist with each prescription and refill. Be sure to read this information carefully each time. Talk to your pediatrician regarding the use of this medicine in children. This medicine is not approved for use in children. Overdosage: If you think you have taken too much of this medicine contact a poison control center or emergency room at once. NOTE: This medicine is only for you. Do not share this medicine with others. What if I miss a dose? If you miss a dose, take  it as soon as you can. If it is almost time for your next dose, take only that dose. Do not take double or extra doses. What may interact with this medicine? -alcohol or any product that contains alcohol -insulin -other stop smoking aids -theophylline -warfarin This list may not describe all possible interactions. Give your health care provider a list of all the medicines, herbs, non-prescription drugs, or dietary supplements you use. Also tell them if you smoke, drink alcohol, or use illegal drugs. Some items may interact with your medicine. What should I watch for while using this medicine? Visit your doctor or health care professional for regular check ups. Ask for ongoing advice and encouragement from your doctor or healthcare professional, friends, and family to help you quit. If you smoke while on this medication, quit again Your mouth may get dry. Chewing sugarless gum or sucking hard candy, and drinking plenty of water may help. Contact your doctor if the problem does not go away or is severe. You may get drowsy or dizzy. Do not drive, use machinery, or do anything that needs mental alertness until you know how this medicine affects you. Do not stand or sit up quickly, especially if you are an older patient. This reduces the risk of dizzy or fainting spells. Sleepwalking can happen during treatment with this medicine, and can sometimes lead to behavior that is harmful to you, other people, or property. Stop taking this medicine and tell your doctor if you start sleepwalking or have other unusual sleep-related activity. Decrease the amount of alcoholic beverages that you drink during treatment with this medicine until you know if this medicine affects your ability to tolerate alcohol. Some people have experienced increased drunkenness (intoxication), unusual or sometimes aggressive behavior, or no memory of things that have happened (amnesia) during treatment with this medicine. The use of this  medicine may increase the chance of suicidal thoughts or actions. Pay special attention to how you are responding while on this medicine. Any worsening of mood, or thoughts of suicide or dying should be reported to your health care professional right away. What side effects may I notice from receiving this medicine? Side effects that you should report to your doctor or health care professional as soon as possible: -allergic reactions like skin rash, itching or hives, swelling of the face, lips, tongue, or throat -acting aggressive, being angry or violent, or acting on dangerous impulses -breathing problems -changes in vision -chest pain or chest tightness -confusion, trouble speaking or understanding -new or worsening depression, anxiety, or panic attacks -extreme increase in activity and talking (mania) -fast, irregular heartbeat -feeling faint or lightheaded, falls -fever -pain in legs when walking -problems with balance, talking, walking -redness, blistering, peeling or loosening of the skin, including inside the mouth -ringing in ears -seeing or hearing things that aren't there (hallucinations) -seizures -sleepwalking -sudden numbness or weakness of the face, arm or leg -thoughts  about suicide or dying, or attempts to commit suicide -trouble passing urine or change in the amount of urine -unusual bleeding or bruising -unusually weak or tired Side effects that usually do not require medical attention (report to your doctor or health care professional if they continue or are bothersome): -constipation -headache -nausea, vomiting -strange dreams -stomach gas -trouble sleeping This list may not describe all possible side effects. Call your doctor for medical advice about side effects. You may report side effects to FDA at 1-800-FDA-1088. Where should I keep my medicine? Keep out of the reach of children. Store at room temperature between 15 and 30 degrees C (59 and 86 degrees F).  Throw away any unused medicine after the expiration date. NOTE: This sheet is a summary. It may not cover all possible information. If you have questions about this medicine, talk to your doctor, pharmacist, or health care provider.    2016, Elsevier/Gold Standard. (2015-03-19 16:14:23)

## 2016-05-26 LAB — HIV ANTIBODY (ROUTINE TESTING W REFLEX): HIV: NONREACTIVE

## 2016-05-30 ENCOUNTER — Other Ambulatory Visit: Payer: Self-pay

## 2016-05-30 DIAGNOSIS — R351 Nocturia: Principal | ICD-10-CM

## 2016-05-30 DIAGNOSIS — Z720 Tobacco use: Secondary | ICD-10-CM

## 2016-05-30 DIAGNOSIS — N401 Enlarged prostate with lower urinary tract symptoms: Secondary | ICD-10-CM

## 2016-05-30 MED ORDER — VARENICLINE TARTRATE 1 MG PO TABS: 1.0000 mg | ORAL_TABLET | Freq: Two times a day (BID) | ORAL | 3 refills | Status: DC

## 2016-05-30 MED ORDER — TAMSULOSIN HCL 0.4 MG PO CAPS: 0.4000 mg | ORAL_CAPSULE | Freq: Every day | ORAL | 3 refills | Status: DC

## 2016-05-30 MED ORDER — VARENICLINE TARTRATE 0.5 MG X 11 & 1 MG X 42 PO MISC
ORAL | 0 refills | Status: DC
Start: 2016-05-30 — End: 2017-03-14

## 2016-09-05 DIAGNOSIS — Z1211 Encounter for screening for malignant neoplasm of colon: Secondary | ICD-10-CM | POA: Diagnosis not present

## 2016-09-05 DIAGNOSIS — D123 Benign neoplasm of transverse colon: Secondary | ICD-10-CM | POA: Diagnosis not present

## 2016-09-05 DIAGNOSIS — K648 Other hemorrhoids: Secondary | ICD-10-CM | POA: Diagnosis not present

## 2016-09-05 DIAGNOSIS — D125 Benign neoplasm of sigmoid colon: Secondary | ICD-10-CM | POA: Diagnosis not present

## 2016-09-05 LAB — HM COLONOSCOPY

## 2016-09-11 ENCOUNTER — Encounter: Payer: Self-pay | Admitting: Family Medicine

## 2017-03-14 ENCOUNTER — Ambulatory Visit (INDEPENDENT_AMBULATORY_CARE_PROVIDER_SITE_OTHER): Payer: BLUE CROSS/BLUE SHIELD | Admitting: Physician Assistant

## 2017-03-14 ENCOUNTER — Encounter: Payer: Self-pay | Admitting: Physician Assistant

## 2017-03-14 VITALS — BP 117/77 | HR 81 | Wt 162.0 lb

## 2017-03-14 DIAGNOSIS — R531 Weakness: Secondary | ICD-10-CM

## 2017-03-14 DIAGNOSIS — G8929 Other chronic pain: Secondary | ICD-10-CM | POA: Diagnosis not present

## 2017-03-14 DIAGNOSIS — M542 Cervicalgia: Secondary | ICD-10-CM | POA: Diagnosis not present

## 2017-03-14 DIAGNOSIS — F341 Dysthymic disorder: Secondary | ICD-10-CM

## 2017-03-14 DIAGNOSIS — R4189 Other symptoms and signs involving cognitive functions and awareness: Secondary | ICD-10-CM | POA: Diagnosis not present

## 2017-03-14 DIAGNOSIS — F419 Anxiety disorder, unspecified: Secondary | ICD-10-CM

## 2017-03-14 DIAGNOSIS — R5383 Other fatigue: Secondary | ICD-10-CM

## 2017-03-14 MED ORDER — FLUOXETINE HCL 20 MG PO TABS: 20.0000 mg | ORAL_TABLET | Freq: Every day | ORAL | 1 refills | Status: DC

## 2017-03-14 MED ORDER — MELOXICAM 15 MG PO TABS: 15.0000 mg | ORAL_TABLET | Freq: Every day | ORAL | 1 refills | Status: DC

## 2017-03-14 MED ORDER — ORPHENADRINE CITRATE ER 100 MG PO TB12: 100.0000 mg | ORAL_TABLET | Freq: Two times a day (BID) | ORAL | 1 refills | Status: DC
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
## 2017-03-14 NOTE — Patient Instructions (Signed)
Start prozac 1/2 tablet then increase to 1 full tablet in 7 days.  Norflex as muscle relaxer  mobic daily.  Will call to start PT.

## 2017-03-17 DIAGNOSIS — R4189 Other symptoms and signs involving cognitive functions and awareness: Secondary | ICD-10-CM | POA: Diagnosis not present

## 2017-03-17 DIAGNOSIS — F419 Anxiety disorder, unspecified: Secondary | ICD-10-CM | POA: Diagnosis not present

## 2017-03-17 DIAGNOSIS — F341 Dysthymic disorder: Secondary | ICD-10-CM | POA: Diagnosis not present

## 2017-03-17 DIAGNOSIS — R531 Weakness: Secondary | ICD-10-CM | POA: Diagnosis not present

## 2017-03-18 LAB — VITAMIN D 25 HYDROXY (VIT D DEFICIENCY, FRACTURES): Vit D, 25-Hydroxy: 34 ng/mL (ref 30–100)

## 2017-03-18 LAB — CBC
HCT: 45.4 % (ref 38.5–50.0)
HEMOGLOBIN: 15.3 g/dL (ref 13.2–17.1)
MCH: 29.9 pg (ref 27.0–33.0)
MCHC: 33.7 g/dL (ref 32.0–36.0)
MCV: 88.7 fL (ref 80.0–100.0)
MPV: 11.5 fL (ref 7.5–12.5)
Platelets: 150 10*3/uL (ref 140–400)
RBC: 5.12 MIL/uL (ref 4.20–5.80)
RDW: 13.2 % (ref 11.0–15.0)
WBC: 7.1 10*3/uL (ref 3.8–10.8)

## 2017-03-18 LAB — COMPREHENSIVE METABOLIC PANEL
ALBUMIN: 4.1 g/dL (ref 3.6–5.1)
ALT: 12 U/L (ref 9–46)
AST: 17 U/L (ref 10–35)
Alkaline Phosphatase: 57 U/L (ref 40–115)
BUN: 12 mg/dL (ref 7–25)
CHLORIDE: 106 mmol/L (ref 98–110)
CO2: 24 mmol/L (ref 20–32)
CREATININE: 1.28 mg/dL (ref 0.70–1.33)
Calcium: 8.8 mg/dL (ref 8.6–10.3)
Glucose, Bld: 70 mg/dL (ref 65–99)
Potassium: 3.9 mmol/L (ref 3.5–5.3)
Sodium: 142 mmol/L (ref 135–146)
TOTAL PROTEIN: 6 g/dL — AB (ref 6.1–8.1)
Total Bilirubin: 0.6 mg/dL (ref 0.2–1.2)

## 2017-03-18 LAB — SEDIMENTATION RATE: Sed Rate: 1 mm/hr (ref 0–20)

## 2017-03-18 LAB — FERRITIN: Ferritin: 214 ng/mL (ref 20–380)

## 2017-03-18 LAB — VITAMIN B12: Vitamin B-12: 246 pg/mL (ref 200–1100)

## 2017-03-18 LAB — TSH: TSH: 1.08 mIU/L (ref 0.40–4.50)

## 2017-03-18 LAB — TESTOSTERONE: TESTOSTERONE: 518 ng/dL (ref 250–827)

## 2017-03-19 ENCOUNTER — Encounter: Payer: Self-pay | Admitting: Physician Assistant

## 2017-03-19 DIAGNOSIS — F341 Dysthymic disorder: Secondary | ICD-10-CM | POA: Insufficient documentation

## 2017-03-19 DIAGNOSIS — R531 Weakness: Secondary | ICD-10-CM | POA: Insufficient documentation

## 2017-03-19 DIAGNOSIS — R5383 Other fatigue: Secondary | ICD-10-CM | POA: Insufficient documentation

## 2017-03-19 DIAGNOSIS — F419 Anxiety disorder, unspecified: Secondary | ICD-10-CM | POA: Insufficient documentation

## 2017-03-19 DIAGNOSIS — R4189 Other symptoms and signs involving cognitive functions and awareness: Secondary | ICD-10-CM | POA: Insufficient documentation

## 2017-03-19 DIAGNOSIS — M542 Cervicalgia: Principal | ICD-10-CM

## 2017-03-19 DIAGNOSIS — G8929 Other chronic pain: Secondary | ICD-10-CM | POA: Insufficient documentation

## 2017-03-21 LAB — C-REACTIVE PROTEIN: CRP: 1.6 mg/L (ref <8.0)

## 2017-04-21 ENCOUNTER — Ambulatory Visit: Payer: Self-pay | Admitting: Physician Assistant

## 2017-04-26 ENCOUNTER — Ambulatory Visit (INDEPENDENT_AMBULATORY_CARE_PROVIDER_SITE_OTHER): Payer: BLUE CROSS/BLUE SHIELD | Admitting: Physician Assistant

## 2017-04-26 ENCOUNTER — Encounter: Payer: Self-pay | Admitting: Physician Assistant

## 2017-04-26 VITALS — BP 144/72 | HR 72 | Ht 67.0 in | Wt 159.0 lb

## 2017-04-26 DIAGNOSIS — F411 Generalized anxiety disorder: Secondary | ICD-10-CM | POA: Diagnosis not present

## 2017-04-26 DIAGNOSIS — R4189 Other symptoms and signs involving cognitive functions and awareness: Secondary | ICD-10-CM | POA: Diagnosis not present

## 2017-04-26 DIAGNOSIS — G44209 Tension-type headache, unspecified, not intractable: Secondary | ICD-10-CM | POA: Diagnosis not present

## 2017-04-26 DIAGNOSIS — F341 Dysthymic disorder: Secondary | ICD-10-CM | POA: Diagnosis not present

## 2017-04-26 DIAGNOSIS — G8929 Other chronic pain: Secondary | ICD-10-CM

## 2017-04-26 DIAGNOSIS — M542 Cervicalgia: Secondary | ICD-10-CM

## 2017-04-26 MED ORDER — FLUOXETINE HCL 10 MG PO TABS: 10.0000 mg | ORAL_TABLET | Freq: Every day | ORAL | 1 refills | Status: DC

## 2017-04-26 MED ORDER — BUPROPION HCL ER (XL) 150 MG PO TB24: 150.0000 mg | ORAL_TABLET | Freq: Every day | ORAL | 1 refills | Status: DC
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
## 2017-04-29 ENCOUNTER — Encounter: Payer: Self-pay | Admitting: Physician Assistant

## 2017-04-29 DIAGNOSIS — G44209 Tension-type headache, unspecified, not intractable: Secondary | ICD-10-CM | POA: Insufficient documentation

## 2017-05-01 ENCOUNTER — Encounter: Payer: Self-pay | Admitting: Physician Assistant

## 2018-04-23 ENCOUNTER — Other Ambulatory Visit: Payer: Self-pay

## 2018-04-23 ENCOUNTER — Emergency Department
Admission: EM | Admit: 2018-04-23 | Discharge: 2018-04-23 | Disposition: A | Discharge status: Home / Self Care | Payer: BLUE CROSS/BLUE SHIELD | Source: Home / Self Care | Attending: Family Medicine | Admitting: Family Medicine

## 2018-04-23 ENCOUNTER — Encounter: Payer: Self-pay | Admitting: *Deleted

## 2018-04-23 DIAGNOSIS — J069 Acute upper respiratory infection, unspecified: Secondary | ICD-10-CM

## 2018-04-23 DIAGNOSIS — B9789 Other viral agents as the cause of diseases classified elsewhere: Secondary | ICD-10-CM | POA: Diagnosis not present

## 2018-04-23 DIAGNOSIS — J0100 Acute maxillary sinusitis, unspecified: Secondary | ICD-10-CM

## 2018-04-23 DIAGNOSIS — H6502 Acute serous otitis media, left ear: Secondary | ICD-10-CM

## 2018-04-23 MED ORDER — AMOXICILLIN 875 MG PO TABS: 875.0000 mg | ORAL_TABLET | Freq: Two times a day (BID) | ORAL | 0 refills | Status: DC

## 2018-04-23 MED ORDER — PREDNISONE 20 MG PO TABS: ORAL_TABLET | ORAL | 0 refills | Status: DC

## 2018-04-23 NOTE — ED Provider Notes (Signed)
[facility]    CSN: [id number] Arrival date & time: 04/23/18  0905     History   Chief Complaint Chief Complaint  Patient presents with  . Headache  . Facial Pain    HPI Jason [name] is a 52 y.o. male.   Patient complains of four day history of typical cold-like symptoms developing over several days, including sinus congestion, headache, fatigue, and cough.  His left ear has felt clogged and he developed a sore throat last night.  He has a history of seasonal rhinitis.  The history is provided by the patient.    Past Medical History:  Diagnosis Date  . Anxiety   . BPH (benign prostatic hyperplasia) 10/15/2014  . Hyperlipidemia 10/16/2014   AHA 10 year risk of 2.8% March 2016     Patient Active Problem List   Diagnosis Date Noted  . Tension headache 04/29/2017  . Cognitive changes 03/19/2017  . Weakness 03/19/2017  . No energy 03/19/2017  . Anxiety 03/19/2017  . Dysthymia 03/19/2017  . Neck pain, chronic 03/19/2017  . Hyperlipidemia 10/16/2014  . BPH (benign prostatic hyperplasia) 10/15/2014  . Enlarged prostate without lower urinary tract symptoms (luts) 10/15/2014  . Tobacco abuse 08/15/2012  . Tobacco use 08/15/2012  . Anxiety, generalized 08/18/2011  . Generalized anxiety disorder 08/18/2011    History reviewed. No pertinent surgical history.     Home Medications    Prior to Admission medications   Medication Sig Start Date End Date Taking? Authorizing Provider  amoxicillin (AMOXIL) 875 MG tablet Take 1 tablet (875 mg total) by mouth 2 (two) times daily. 04/23/18   Lattie Haw, MD  predniSONE (DELTASONE) 20 MG tablet Take one tab by mouth twice daily for 5 days, then one daily for 3 days. Take with food. 04/23/18   Lattie Haw, MD    Family History Family History  Problem Relation Age of Onset  . Bipolar disorder Father   . Heart attack Father 65       pacemaker, smoker  . Alcohol abuse Paternal Uncle   . Hypertension  Unknown   . Alcohol abuse Unknown     Social History Social History   Tobacco Use  . Smoking status: Current Every Day Smoker    Packs/day: 1.00    Types: Cigarettes  . Smokeless tobacco: Never Used  Substance Use Topics  . Alcohol use: Yes    Alcohol/week: 0.0 standard drinks    Comment: drinks maybe once a month.    . Drug use: No     Allergies   Cholestatin   Review of Systems Review of Systems + sore throat + cough No pleuritic pain No wheezing + nasal congestion + post-nasal drainage + sinus pain/pressure No itchy/red eyes + left earache No hemoptysis No SOB No fever, ? chills No nausea No vomiting No abdominal pain No diarrhea No urinary symptoms No skin rash + fatigue No myalgias + headache Used OTC meds without relief   Physical Exam Triage Vital Signs ED Triage Vitals  Enc Vitals Group     BP 04/23/18 0940 118/81     Pulse Rate 04/23/18 0940 94     Resp 04/23/18 0940 18     Temp 04/23/18 0940 98.1 F (36.7 C)     Temp Source 04/23/18 0940 Oral     SpO2 04/23/18 0940 99 %     Weight 04/23/18 0941 166 lb (75.3 kg)     Height 04/23/18 0941 6' (1.829 m)  Head Circumference --      Peak Flow --      Pain Score 04/23/18 0940 5     Pain Loc --      Pain Edu? --      Excl. in GC? --    No data found.  Updated Vital Signs BP 118/81 (BP Location: Right Arm)   Pulse 94   Temp 98.1 F (36.7 C) (Oral)   Resp 18   Ht 6' (1.829 m)   Wt 75.3 kg   SpO2 99%   BMI 22.51 kg/m   Visual Acuity Right Eye Distance:   Left Eye Distance:   Bilateral Distance:    Right Eye Near:   Left Eye Near:    Bilateral Near:     Physical Exam Nursing notes and Vital Signs reviewed. Appearance:  Patient appears stated age, and in no acute distress Eyes:  Pupils are equal, round, and reactive to light and accomodation.  Extraocular movement is intact.  Conjunctivae are not inflamed  Ears:  Canals normal.  Right tympanic membrane normal; left  tympanic membrane has serous effusion. Nose:  Congested turbinates, more pronounced on the left.  Left maxillary sinus tenderness is present.  Pharynx:  Uvula erythematous. Neck:  Supple.  Enlarged posterior/lateral nodes are palpated bilaterally, tender to palpation on the left.   Lungs:  Clear to auscultation.  Breath sounds are equal.  Moving air well. Heart:  Regular rate and rhythm without murmurs, rubs, or gallops.  Abdomen:  Nontender without masses or hepatosplenomegaly.  Bowel sounds are present.  No CVA or flank tenderness.  Extremities:  No edema.  Skin:  No rash present.    UC Treatments / Results  Labs (all labs ordered are listed, but only abnormal results are displayed) Labs Reviewed - No data to display  EKG None  Radiology No results found.  Procedures Procedures (including critical care time)  Medications Ordered in UC Medications - No data to display  Initial Impression / Assessment and Plan / UC Course  I have reviewed the triage vital signs and the nursing notes.  Pertinent labs & imaging results that were available during my care of the patient were reviewed by me and considered in my medical decision making (see chart for details).    Begin amoxicillin and prednisone burst/taper. Followup with Family Doctor if not improved in about 10 days.   Final Clinical Impressions(s) / UC Diagnoses   Final diagnoses:  Viral URI with cough  Acute maxillary sinusitis, recurrence not specified  Acute serous otitis media of left ear, recurrence not specified     Discharge Instructions     Take plain guaifenesin (1200mg  extended release tabs such as Mucinex) twice daily, with plenty of water, for cough and congestion.  May add Pseudoephedrine (30mg , one or two every 4 to 6 hours) for sinus congestion.  Get adequate rest.   May use Afrin nasal spray (or generic oxymetazoline) each morning for about 5 days and then discontinue.  Also recommend using saline nasal  spray several times daily and saline nasal irrigation (AYR is a common brand).  Use Flonase nasal spray each morning after using Afrin nasal spray and saline nasal irrigation. Try warm salt water gargles for sore throat.  Stop all antihistamines for now, and other non-prescription cough/cold preparations. May take Tylenol for pain, fever, etc. May take Delsym Cough Suppressant at bedtime for nighttime cough.        ED Prescriptions    Medication Sig Dispense  Auth. Provider   amoxicillin (AMOXIL) 875 MG tablet Take 1 tablet (875 mg total) by mouth 2 (two) times daily. 20 tablet Lattie Haw, MD   predniSONE (DELTASONE) 20 MG tablet Take one tab by mouth twice daily for 5 days, then one daily for 3 days. Take with food. 13 tablet Lattie Haw, MD         Lattie Haw, MD 04/23/18 [phone]

## 2018-04-23 NOTE — ED Triage Notes (Signed)
Pt c/o sinus pressure and pain, with HA, and LT ear pain x 3 days. Denies fever.he has taken Tylenol sinus and vicks.

## 2018-04-23 NOTE — Discharge Instructions (Addendum)
Take plain guaifenesin (1200mg  extended release tabs such as Mucinex) twice daily, with plenty of water, for cough and congestion.  May add Pseudoephedrine (30mg , one or two every 4 to 6 hours) for sinus congestion.  Get adequate rest.   May use Afrin nasal spray (or generic oxymetazoline) each morning for about 5 days and then discontinue.  Also recommend using saline nasal spray several times daily and saline nasal irrigation (AYR is a common brand).  Use Flonase nasal spray each morning after using Afrin nasal spray and saline nasal irrigation. Try warm salt water gargles for sore throat.  Stop all antihistamines for now, and other non-prescription cough/cold preparations. May take Tylenol for pain, fever, etc. May take Delsym Cough Suppressant at bedtime for nighttime cough.

## 2018-04-27 ENCOUNTER — Encounter: Payer: 59 | Admitting: Physician Assistant

## 2018-05-16 ENCOUNTER — Ambulatory Visit (INDEPENDENT_AMBULATORY_CARE_PROVIDER_SITE_OTHER): Payer: BLUE CROSS/BLUE SHIELD

## 2018-05-16 ENCOUNTER — Ambulatory Visit (INDEPENDENT_AMBULATORY_CARE_PROVIDER_SITE_OTHER): Payer: BLUE CROSS/BLUE SHIELD | Admitting: Physician Assistant

## 2018-05-16 ENCOUNTER — Encounter: Payer: Self-pay | Admitting: Physician Assistant

## 2018-05-16 VITALS — BP 116/86 | HR 81 | Ht 72.0 in | Wt 165.0 lb

## 2018-05-16 DIAGNOSIS — F341 Dysthymic disorder: Secondary | ICD-10-CM | POA: Diagnosis not present

## 2018-05-16 DIAGNOSIS — Z131 Encounter for screening for diabetes mellitus: Secondary | ICD-10-CM | POA: Diagnosis not present

## 2018-05-16 DIAGNOSIS — R05 Cough: Secondary | ICD-10-CM

## 2018-05-16 DIAGNOSIS — Z13220 Encounter for screening for lipoid disorders: Secondary | ICD-10-CM

## 2018-05-16 DIAGNOSIS — E538 Deficiency of other specified B group vitamins: Secondary | ICD-10-CM | POA: Diagnosis not present

## 2018-05-16 DIAGNOSIS — Z0000 Encounter for general adult medical examination without abnormal findings: Secondary | ICD-10-CM | POA: Diagnosis not present

## 2018-05-16 DIAGNOSIS — R0981 Nasal congestion: Secondary | ICD-10-CM

## 2018-05-16 DIAGNOSIS — E782 Mixed hyperlipidemia: Secondary | ICD-10-CM | POA: Diagnosis not present

## 2018-05-16 DIAGNOSIS — Z125 Encounter for screening for malignant neoplasm of prostate: Secondary | ICD-10-CM | POA: Diagnosis not present

## 2018-05-16 MED ORDER — BUPROPION HCL ER (XL) 150 MG PO TB24: 150.0000 mg | ORAL_TABLET | ORAL | 0 refills | Status: DC

## 2018-05-16 MED ORDER — FLUTICASONE PROPIONATE 50 MCG/ACT NA SUSP: 2.0000 | Freq: Every day | NASAL | 2 refills | Status: DC

## 2018-05-16 MED ORDER — PREDNISONE 20 MG PO TABS: ORAL_TABLET | ORAL | 0 refills | Status: DC

## 2018-05-16 NOTE — Progress Notes (Signed)
Subjective:    Patient ID: Jason [name], male    DOB: [birth year], 52 y.o.   MRN: [id number]  HPI Pt is a 51 yo male with  Anxiety, dysthmia, BPH, HLD who presents to the clinic for CPE.   Overall patient is doing ok. He still struggles with his mood. His wife really notices when he is not taking anything for depression. No SI/HC. He would like to try something else for mood.   He continues to smoke daily.   He is frustrated with his constant nasal congestion. He uses afrin frequently and helps but then congestion worsens.   .. Active Ambulatory Problems    Diagnosis Date Noted  . Anxiety, generalized 08/18/2011  . Tobacco abuse 08/15/2012  . BPH (benign prostatic hyperplasia) 10/15/2014  . Hyperlipidemia 10/16/2014  . Cognitive changes 03/19/2017  . Weakness 03/19/2017  . No energy 03/19/2017  . Anxiety 03/19/2017  . Dysthymia 03/19/2017  . Neck pain, chronic 03/19/2017  . Tension headache 04/29/2017  . Enlarged prostate without lower urinary tract symptoms (luts) 10/15/2014  . Generalized anxiety disorder 08/18/2011  . Tobacco use 08/15/2012   Resolved Ambulatory Problems    Diagnosis Date Noted  . No Resolved Ambulatory Problems   No Additional Past Medical History   .Marland Kitchen Family History  Problem Relation Age of Onset  . Bipolar disorder Father   . Heart attack Father 74       pacemaker, smoker  . Alcohol abuse Paternal Uncle   . Hypertension Unknown   . Alcohol abuse Unknown    .Marland Kitchen Social History   Socioeconomic History  . Marital status: Married    Spouse name: [name]  . Number of children: 2  . Years of education: HS  . Highest education level: Not on file  Occupational History  . Occupation: Radio broadcast assistant.     Comment: [employer]  Social Needs  . Financial resource strain: Not on file  . Food insecurity:    Worry: Not on file    Inability: Not on file  . Transportation needs:    Medical: Not on file    Non-medical: Not on file  Tobacco  Use  . Smoking status: Current Every Day Smoker    Packs/day: 1.00    Types: Cigarettes  . Smokeless tobacco: Never Used  Substance and Sexual Activity  . Alcohol use: Yes    Alcohol/week: 0.0 standard drinks    Comment: drinks maybe once a month.    . Drug use: No  . Sexual activity: Yes  Lifestyle  . Physical activity:    Days per week: Not on file    Minutes per session: Not on file  . Stress: Not on file  Relationships  . Social connections:    Talks on phone: Not on file    Gets together: Not on file    Attends religious service: Not on file    Active member of club or organization: Not on file    Attends meetings of clubs or organizations: Not on file    Relationship status: Not on file  . Intimate partner violence:    Fear of current or ex partner: Not on file    Emotionally abused: Not on file    Physically abused: Not on file    Forced sexual activity: Not on file  Other Topics Concern  . Not on file  Social History Narrative   No regular exercise but physical job.  1 pot of coffee a day.  Review of Systems  All other systems reviewed and are negative.      Objective:   Physical Exam BP 116/86   Pulse 81   Ht 6' (1.829 m)   Wt 165 lb (74.8 kg)   BMI 22.38 kg/m   General Appearance:    Alert, cooperative, no distress, appears stated age  Head:    Normocephalic, without obvious abnormality, atraumatic  Eyes:    PERRL, conjunctiva/corneas clear, EOM's intact, fundi    benign, both eyes       Ears:    Normal TM's and external ear canals, both ears  Nose:   Nares normal, septum midline, mucosa normal, no drainage    or sinus tenderness  Throat:   Lips, mucosa, and tongue normal; teeth and gums normal  Neck:   Supple, symmetrical, trachea midline, no adenopathy;       thyroid:  No enlargement/tenderness/nodules; no carotid   bruit or JVD  Back:     Symmetric, no curvature, ROM normal, no CVA tenderness  Lungs:     Clear to auscultation  bilaterally, respirations unlabored  Chest wall:    No tenderness or deformity  Heart:    Regular rate and rhythm, S1 and S2 normal, no murmur, rub   or gallop  Abdomen:     Soft, non-tender, bowel sounds active all four quadrants,    no masses, no organomegaly        Extremities:   Extremities normal, atraumatic, no cyanosis or edema  Pulses:   2+ and symmetric all extremities  Skin:   Skin color, texture, turgor normal, no rashes or lesions  Lymph nodes:   Cervical, supraclavicular, and axillary nodes normal  Neurologic:   CNII-XII intact. Normal strength, sensation and reflexes      throughout         Assessment & Plan:  Marland KitchenMarland KitchenDiagnoses and all orders for this visit:  Routine physical examination -     PSA -     COMPLETE METABOLIC PANEL WITH GFR -     VITAMIN D 25 Hydroxy (Vit-D Deficiency, Fractures) -     B12 and Folate Panel -     TSH -     CBC with Differential/Platelet -     Lipid Panel w/reflex Direct LDL -     DG Chest 2 View  Dysthymia -     buPROPion (WELLBUTRIN XL) 150 MG 24 hr tablet; Take 1 tablet (150 mg total) by mouth every morning.  Mixed hyperlipidemia  Screening for diabetes mellitus -     COMPLETE METABOLIC PANEL WITH GFR  Screening for lipid disorders -     Lipid Panel w/reflex Direct LDL  Prostate cancer screening -     PSA  Nasal congestion -     fluticasone (FLONASE) 50 MCG/ACT nasal spray; Place 2 sprays into both nostrils daily. -     predniSONE (DELTASONE) 20 MG tablet; Take 3 tablets for 3 days, take 2 tablets for 3 days, take 1 tablets for 3 days, 1/2 tablet for 4 days.   .. Depression screen [facility] 2/9 05/16/2018 04/26/2017 03/14/2017 03/14/2017  Decreased Interest 2 1 1 1   Down, Depressed, Hopeless 0 1 1 1   PHQ - 2 Score 2 2 2 2   Altered sleeping 1 - 3 -  Tired, decreased energy 1 2 2  -  Change in appetite 0 1 1 -  Feeling bad or failure about yourself  0 0 0 -  Trouble concentrating 0 1 1 -  Moving slowly or fidgety/restless 0 2 2 -   Suicidal thoughts 0 0 0 -  PHQ-9 Score 4 - 11 -  Difficult doing work/chores Somewhat difficult - - -   .Marland Kitchen GAD 7 : Generalized Anxiety Score 05/16/2018 04/26/2017 03/14/2017  Nervous, Anxious, on Edge 1 2 1   Control/stop worrying 0 1 2  Worry too much - different things 0 1 2  Trouble relaxing 1 1 2   Restless 1 0 3  Easily annoyed or irritable 1 1 1   Afraid - awful might happen 0 0 1  Total GAD 7 Score 4 6 12   Anxiety Difficulty Somewhat difficult - -    .Marland KitchenStart a regular exercise program and make sure you are eating a healthy diet Try to eat 4 servings of dairy a day or take a calcium supplement (500mg  twice a day). Pt declined flu and shingles.  Fasting labs ordered.  Colonoscopy up to date.  PSA ordered today.  AUA low score.   Will try wellbutrin for mood. Perhaps another class of medications would work better for patient. Follow up in 1-2 months.   Stop using afrin. Discussed rebound congestion. Start prednisone burst and flonase daily.   Strongly encouraged smoking cessation. Pt declined today. Consider spirometry in the future to characterize lung function. Will get CXR due to cough and smoking hx.

## 2018-05-16 NOTE — Progress Notes (Signed)
Call pt: CXR looks great.

## 2018-05-16 NOTE — Patient Instructions (Signed)
Consider spirometry when feeling better. 1 month Consider chantix for smoking cessation.   Start prednisone taper and flonase daily. STOP AFRIN.  Get CXR and lab today.  Start wellbutrin for mood.    Health Maintenance, Male A healthy lifestyle and preventive care is important for your health and wellness. Ask your health care provider about what schedule of regular examinations is right for you. What should I know about weight and diet? Eat a Healthy Diet  Eat plenty of vegetables, fruits, whole grains, low-fat dairy products, and lean protein.  Do not eat a lot of foods high in solid fats, added sugars, or salt.  Maintain a Healthy Weight Regular exercise can help you achieve or maintain a healthy weight. You should:  Do at least 150 minutes of exercise each week. The exercise should increase your heart rate and make you sweat (moderate-intensity exercise).  Do strength-training exercises at least twice a week.  Watch Your Levels of Cholesterol and Blood Lipids  Have your blood tested for lipids and cholesterol every 5 years starting at 53 years of age. If you are at high risk for heart disease, you should start having your blood tested when you are 53 years old. You may need to have your cholesterol levels checked more often if: ? Your lipid or cholesterol levels are high. ? You are older than 53 years of age. ? You are at high risk for heart disease.  What should I know about cancer screening? Many types of cancers can be detected early and may often be prevented. Lung Cancer  You should be screened every year for lung cancer if: ? You are a current smoker who has smoked for at least 30 years. ? You are a former smoker who has quit within the past 15 years.  Talk to your health care provider about your screening options, when you should start screening, and how often you should be screened.  Colorectal Cancer  Routine colorectal cancer screening usually begins at 53  years of age and should be repeated every 5-10 years until you are 53 years old. You may need to be screened more often if early forms of precancerous polyps or small growths are found. Your health care provider may recommend screening at an earlier age if you have risk factors for colon cancer.  Your health care provider may recommend using home test kits to check for hidden blood in the stool.  A small camera at the end of a tube can be used to examine your colon (sigmoidoscopy or colonoscopy). This checks for the earliest forms of colorectal cancer.  Prostate and Testicular Cancer  Depending on your age and overall health, your health care provider may do certain tests to screen for prostate and testicular cancer.  Talk to your health care provider about any symptoms or concerns you have about testicular or prostate cancer.  Skin Cancer  Check your skin from head to toe regularly.  Tell your health care provider about any new moles or changes in moles, especially if: ? There is a change in a mole's size, shape, or color. ? You have a mole that is larger than a pencil eraser.  Always use sunscreen. Apply sunscreen liberally and repeat throughout the day.  Protect yourself by wearing long sleeves, pants, a wide-brimmed hat, and sunglasses when outside.  What should I know about heart disease, diabetes, and high blood pressure?  If you are 28-85 years of age, have your blood pressure checked every 3-5  years. If you are 89 years of age or older, have your blood pressure checked every year. You should have your blood pressure measured twice-once when you are at a hospital or clinic, and once when you are not at a hospital or clinic. Record the average of the two measurements. To check your blood pressure when you are not at a hospital or clinic, you can use: ? An automated blood pressure machine at a pharmacy. ? A home blood pressure monitor.  Talk to your health care provider about your  target blood pressure.  If you are between 80-22 years old, ask your health care provider if you should take aspirin to prevent heart disease.  Have regular diabetes screenings by checking your fasting blood sugar level. ? If you are at a normal weight and have a low risk for diabetes, have this test once every three years after the age of 71. ? If you are overweight and have a high risk for diabetes, consider being tested at a younger age or more often.  A one-time screening for abdominal aortic aneurysm (AAA) by ultrasound is recommended for men aged 24-75 years who are current or former smokers. What should I know about preventing infection? Hepatitis B If you have a higher risk for hepatitis B, you should be screened for this virus. Talk with your health care provider to find out if you are at risk for hepatitis B infection. Hepatitis C Blood testing is recommended for:  Everyone born from [birth year] through 1966.  Anyone with known risk factors for hepatitis C.  Sexually Transmitted Diseases (STDs)  You should be screened each year for STDs including gonorrhea and chlamydia if: ? You are sexually active and are younger than 53 years of age. ? You are older than 53 years of age and your health care provider tells you that you are at risk for this type of infection. ? Your sexual activity has changed since you were last screened and you are at an increased risk for chlamydia or gonorrhea. Ask your health care provider if you are at risk.  Talk with your health care provider about whether you are at high risk of being infected with HIV. Your health care provider may recommend a prescription medicine to help prevent HIV infection.  What else can I do?  Schedule regular health, dental, and eye exams.  Stay current with your vaccines (immunizations).  Do not use any tobacco products, such as cigarettes, chewing tobacco, and e-cigarettes. If you need help quitting, ask your health care  provider.  Limit alcohol intake to no more than 2 drinks per day. One drink equals 12 ounces of beer, 5 ounces of wine, or 1 ounces of hard liquor.  Do not use street drugs.  Do not share needles.  Ask your health care provider for help if you need support or information about quitting drugs.  Tell your health care provider if you often feel depressed.  Tell your health care provider if you have ever been abused or do not feel safe at home. This information is not intended to replace advice given to you by your health care provider. Make sure you discuss any questions you have with your health care provider. Document Released: 12/31/2007 Document Revised: 03/02/2016 Document Reviewed: 04/07/2015 Elsevier Interactive Patient Education  [employer].

## 2018-05-17 ENCOUNTER — Encounter: Payer: Self-pay | Admitting: Physician Assistant

## 2018-05-17 DIAGNOSIS — R0981 Nasal congestion: Secondary | ICD-10-CM | POA: Insufficient documentation

## 2018-05-17 LAB — COMPLETE METABOLIC PANEL WITH GFR
AG Ratio: 2.1 (calc) (ref 1.0–2.5)
ALT: 18 U/L (ref 9–46)
AST: 18 U/L (ref 10–35)
Albumin: 4.2 g/dL (ref 3.6–5.1)
Alkaline phosphatase (APISO): 62 U/L (ref 40–115)
BUN: 13 mg/dL (ref 7–25)
CALCIUM: 9.3 mg/dL (ref 8.6–10.3)
CO2: 27 mmol/L (ref 20–32)
CREATININE: 1.31 mg/dL (ref 0.70–1.33)
Chloride: 104 mmol/L (ref 98–110)
GFR, EST NON AFRICAN AMERICAN: 62 mL/min/{1.73_m2} (ref >=60)
GFR, Est African American: 72 mL/min/{1.73_m2} (ref >=60)
GLUCOSE: 92 mg/dL (ref 65–99)
Globulin: 2 g/dL (calc) (ref 1.9–3.7)
POTASSIUM: 4.3 mmol/L (ref 3.5–5.3)
Sodium: 138 mmol/L (ref 135–146)
Total Bilirubin: 0.7 mg/dL (ref 0.2–1.2)
Total Protein: 6.2 g/dL (ref 6.1–8.1)

## 2018-05-17 LAB — CBC WITH DIFFERENTIAL/PLATELET
BASOS PCT: 0.4 %
Basophils Absolute: 27 cells/uL (ref 0–200)
Eosinophils Absolute: 48 cells/uL (ref 15–500)
Eosinophils Relative: 0.7 %
HCT: 47.4 % (ref 38.5–50.0)
Hemoglobin: 16.1 g/dL (ref 13.2–17.1)
Lymphs Abs: 1748 cells/uL (ref 850–3900)
MCH: 28.8 pg (ref 27.0–33.0)
MCHC: 34 g/dL (ref 32.0–36.0)
MCV: 84.8 fL (ref 80.0–100.0)
MPV: 11.8 fL (ref 7.5–12.5)
Monocytes Relative: 5.6 %
NEUTROS ABS: 4597 {cells}/uL (ref 1500–7800)
Neutrophils Relative %: 67.6 %
PLATELETS: 161 10*3/uL (ref 140–400)
RBC: 5.59 10*6/uL (ref 4.20–5.80)
RDW: 12.1 % (ref 11.0–15.0)
TOTAL LYMPHOCYTE: 25.7 %
WBC: 6.8 10*3/uL (ref 3.8–10.8)
WBCMIX: 381 {cells}/uL (ref 200–950)

## 2018-05-17 LAB — VITAMIN D 25 HYDROXY (VIT D DEFICIENCY, FRACTURES): VIT D 25 HYDROXY: 34 ng/mL (ref 30–100)

## 2018-05-17 LAB — B12 AND FOLATE PANEL
FOLATE: 5.2 ng/mL — AB
Vitamin B-12: 343 pg/mL (ref 200–1100)

## 2018-05-17 LAB — LIPID PANEL W/REFLEX DIRECT LDL
CHOLESTEROL: 205 mg/dL — AB (ref <200)
HDL: 46 mg/dL (ref >40)
LDL CHOLESTEROL (CALC): 143 mg/dL — AB
Non-HDL Cholesterol (Calc): 159 mg/dL (calc) — ABNORMAL HIGH (ref <130)
TRIGLYCERIDES: 70 mg/dL (ref <150)
Total CHOL/HDL Ratio: 4.5 (calc) (ref <5.0)

## 2018-05-17 LAB — TSH: TSH: 1.13 mIU/L (ref 0.40–4.50)

## 2018-05-17 LAB — PSA: PSA: 0.5 ng/mL (ref <=4.0)

## 2018-05-17 NOTE — Progress Notes (Signed)
Call pt: PSA great and stable. Kidney, liver, glucose looks good.  Vitamin D normal.  Thyroid great.  LDL is a little elevated with no other risk factor for now work on low fat/fried/processed foods.  b12 a little low. Start b12 1000 mcg daily.

## 2018-11-08 ENCOUNTER — Telehealth: Payer: Self-pay

## 2018-11-08 NOTE — Telephone Encounter (Signed)
A family member called to have [place] send in new codes to [facility] so the insurance will pay for the vitamin D.    M81.0  Age-related osteoporosis without current pathological fracture   N18.3  Chronic kidney disease, stage 3 (moderate)   N18.4  Chronic kidney disease, stage 4 (severe)   M85.9  Disorder of bone density and structure, unspecified   M89.9  Disorder of bone, unspecified   M79.7  Fibromyalgia   E83.52  Hypercalcemia   E21.3  Hyperparathyroidism, unspecified   E83.51  Hypocalcemia   K90.9  Intestinal malabsorption, unspecified   M79.10  Myalgia, unspecified site   E83.39  Other disorders of phosphorus metabolism   Z79.899  Other long term (current) drug therapy   M81.8  Other osteoporosis without current pathological fracture   M85.80  Other specified disorders of bone density and structure, unspecified site   E21.0  Primary hyperparathyroidism   N25.81  Secondary hyperparathyroidism of renal origin   E21.1  Secondary hyperparathyroidism, not elsewhere classified   M32.9  Systemic lupus erythematosus, unspecified   E55.9  Vitamin D deficiency, unspecified

## 2018-11-09 NOTE — Telephone Encounter (Signed)
Long term drug therapy.

## 2018-11-13 NOTE — Telephone Encounter (Signed)
Claim was adjusted. No balance due.

## 2019-02-03 DIAGNOSIS — Z20828 Contact with and (suspected) exposure to other viral communicable diseases: Secondary | ICD-10-CM | POA: Diagnosis not present

## 2019-02-07 ENCOUNTER — Encounter: Payer: Self-pay | Admitting: Physician Assistant

## 2019-02-07 ENCOUNTER — Telehealth (INDEPENDENT_AMBULATORY_CARE_PROVIDER_SITE_OTHER): Payer: BLUE CROSS/BLUE SHIELD | Admitting: Physician Assistant

## 2019-02-07 VITALS — BP 115/80 | Ht 72.0 in | Wt 162.0 lb

## 2019-02-07 DIAGNOSIS — R5383 Other fatigue: Secondary | ICD-10-CM

## 2019-02-07 DIAGNOSIS — Z125 Encounter for screening for malignant neoplasm of prostate: Secondary | ICD-10-CM

## 2019-02-07 DIAGNOSIS — J0140 Acute pansinusitis, unspecified: Secondary | ICD-10-CM | POA: Diagnosis not present

## 2019-02-07 DIAGNOSIS — M542 Cervicalgia: Secondary | ICD-10-CM

## 2019-02-07 DIAGNOSIS — Z131 Encounter for screening for diabetes mellitus: Secondary | ICD-10-CM | POA: Diagnosis not present

## 2019-02-07 DIAGNOSIS — F17200 Nicotine dependence, unspecified, uncomplicated: Secondary | ICD-10-CM

## 2019-02-07 DIAGNOSIS — G8929 Other chronic pain: Secondary | ICD-10-CM

## 2019-02-07 DIAGNOSIS — Z13220 Encounter for screening for lipoid disorders: Secondary | ICD-10-CM | POA: Diagnosis not present

## 2019-02-07 MED ORDER — MELOXICAM 15 MG PO TABS: 15.0000 mg | ORAL_TABLET | Freq: Every day | ORAL | 5 refills | Status: DC

## 2019-02-07 MED ORDER — CHANTIX STARTING MONTH PAK 0.5 MG X 11 & 1 MG X 42 PO TABS: ORAL_TABLET | ORAL | 0 refills | Status: DC

## 2019-02-07 MED ORDER — BACLOFEN 10 MG PO TABS: 10.0000 mg | ORAL_TABLET | Freq: Every day | ORAL | 5 refills | Status: DC

## 2019-02-07 MED ORDER — AMOXICILLIN-POT CLAVULANATE 875-125 MG PO TABS: 1.0000 | ORAL_TABLET | Freq: Two times a day (BID) | ORAL | 0 refills | Status: DC
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
## 2019-02-07 NOTE — Progress Notes (Signed)
Patient ID: Jason [name], male   DOB: 02-08-1966, 53 y.o.   MRN: [id number] . Marland Kitchen.Virtual Visit via Video Note  I connected with Jason [name] on 02/12/19 at  4:20 PM EDT by a video enabled telemedicine application and verified that I am speaking with the correct person using two identifiers.  Location: Patient: home Provider: clinic   I discussed the limitations of evaluation and management by telemedicine and the availability of in person appointments. The patient expressed understanding and agreed to proceed.  History of Present Illness: Pt is a 53 yo male with chronic neck pain, tension headaches, current smoker, HLD, GAD who calls into the clinic for labs, med refills and some sinus symptoms.   Today he is most concerned with sinus pressure and drainage gone on for at least 2 weeks. His wife had the same thing and treated with augmentin. He feels a lot of pressure in head and ear pressure. No fever, chills, cough, SOB. He has taken some OTC cold meds with little relief.   He has not had labs checked in a while.   He admits to feeling just more fatigued over last year. He does smoke at least a pack of cigarettes a day. He works outside in the heat. He does drink some water throughout the day. He is sleeping well. His neck pain continues to be a daily problem. He has seen chiropractor in the past and helped a lot. Currently not doing anything to manage this.   .. Active Ambulatory Problems    Diagnosis Date Noted  . Anxiety, generalized 08/18/2011  . Tobacco abuse 08/15/2012  . BPH (benign prostatic hyperplasia) 10/15/2014  . Hyperlipidemia 10/16/2014  . Cognitive changes 03/19/2017  . Weakness 03/19/2017  . No energy 03/19/2017  . Anxiety 03/19/2017  . Dysthymia 03/19/2017  . Neck pain, chronic 03/19/2017  . Tension headache 04/29/2017  . Enlarged prostate without lower urinary tract symptoms (luts) 10/15/2014  . Generalized anxiety disorder 08/18/2011  . Tobacco use  08/15/2012  . Nasal congestion 05/17/2018   Resolved Ambulatory Problems    Diagnosis Date Noted  . No Resolved Ambulatory Problems   No Additional Past Medical History   Reviewed med, allergies, problem list.     Observations/Objective: No acute distress. Sounds congested.  No labored breathing or cough.  Normal appearance and mood.  ROM of neck limited in all directions due to stiffness and pain.   .. Today's Vitals   02/07/19 1140  BP: 115/80  Weight: 162 lb (73.5 kg)  Height: 6' (1.829 m)   Body mass index is 21.97 kg/m.    .. Depression screen [facility] 2/9 02/07/2019 05/16/2018 04/26/2017 03/14/2017 03/14/2017  Decreased Interest 0 2 1 1 1   Down, Depressed, Hopeless 0 0 1 1 1   PHQ - 2 Score 0 2 2 2 2   Altered sleeping - 1 - 3 -  Tired, decreased energy - 1 2 2  -  Change in appetite - 0 1 1 -  Feeling bad or failure about yourself  - 0 0 0 -  Trouble concentrating - 0 1 1 -  Moving slowly or fidgety/restless - 0 2 2 -  Suicidal thoughts - 0 0 0 -  PHQ-9 Score - 4 - 11 -  Difficult doing work/chores - Somewhat difficult - - -   .Marland Kitchen GAD 7 : Generalized Anxiety Score 02/07/2019 05/16/2018 04/26/2017 03/14/2017  Nervous, Anxious, on Edge 0 1 2 1   Control/stop worrying 0 0 1 2  Worry  too much - different things 0 0 1 2  Trouble relaxing 1 1 1 2   Restless 0 1 0 3  Easily annoyed or irritable 1 1 1 1   Afraid - awful might happen 0 0 0 1  Total GAD 7 Score 2 4 6 12   Anxiety Difficulty Not difficult at all Somewhat difficult - -     Assessment and Plan:  Marland Kitchen.Marland Kitchen.Jason [name] was seen today for fatigue, headache and neck pain.  Diagnoses and all orders for this visit:  Acute non-recurrent pansinusitis -     amoxicillin-clavulanate (AUGMENTIN) 875-125 MG tablet; Take 1 tablet by mouth 2 (two) times daily.  Screening for lipid disorders -     Lipid Panel w/reflex Direct LDL  Screening for diabetes mellitus -     COMPLETE METABOLIC PANEL WITH GFR  No energy -     TSH -      CBC with Differential/Platelet -     B12 -     VITAMIN D 25 Hydroxy (Vit-D Deficiency, Fractures)  Current smoker -     varenicline (CHANTIX STARTING MONTH PAK) 0.5 MG X 11 & 1 MG X 42 tablet; Take one 0.5mg  tablet by mouth once daily for 3 days, then increase to one 0.5mg  tablet twice daily for 3 days, then increase to one 1mg  tablet twice daily.  Prostate cancer screening -     PSA  Chronic neck pain -     baclofen (LIORESAL) 10 MG tablet; Take 1 tablet (10 mg total) by mouth at bedtime. -     meloxicam (MOBIC) 15 MG tablet; Take 1 tablet (15 mg total) by mouth daily.   Treated with augmentin for sinus infection.   Labs ordered for screening purposes and looking into fatigue.   Pt could certainly be getting dehydrated during the hot days. Reminded of drinking a lot of water.   Discussed smoking cessation. Concerned his lung function could be decreasing causing him more fatigue. Ordered chantix. Discussed how to use. Follow up in one month at that time taper until stopped smoking. Discussed side effects.   Discussed neck pain. Strongly recommend chiropracter and massage therapist. Added NsAId and muscle relaxer at bedtime. Discussed ROM exercises. I don't see any images. Come in for work up with me or sports medicine to see if anything else we can do. Discussed icy hot patches, tens unit.    Follow Up Instructions:    I discussed the assessment and treatment plan with the patient. The patient was provided an opportunity to ask questions and all were answered. The patient agreed with the plan and demonstrated an understanding of the instructions.   The patient was advised to call back or seek an in-person evaluation if the symptoms worsen or if the condition fails to improve as anticipated.    [name], PA-C

## 2019-03-28 DIAGNOSIS — Z20828 Contact with and (suspected) exposure to other viral communicable diseases: Secondary | ICD-10-CM | POA: Diagnosis not present

## 2019-04-17 DIAGNOSIS — H1032 Unspecified acute conjunctivitis, left eye: Secondary | ICD-10-CM | POA: Diagnosis not present

## 2019-04-18 DIAGNOSIS — S0592XD Unspecified injury of left eye and orbit, subsequent encounter: Secondary | ICD-10-CM | POA: Diagnosis not present

## 2019-04-18 DIAGNOSIS — L03213 Periorbital cellulitis: Secondary | ICD-10-CM | POA: Diagnosis not present

## 2019-04-18 DIAGNOSIS — S0502XA Injury of conjunctiva and corneal abrasion without foreign body, left eye, initial encounter: Secondary | ICD-10-CM | POA: Diagnosis not present

## 2019-04-19 DIAGNOSIS — H1820 Unspecified corneal edema: Secondary | ICD-10-CM | POA: Diagnosis not present

## 2019-04-19 DIAGNOSIS — H1132 Conjunctival hemorrhage, left eye: Secondary | ICD-10-CM | POA: Diagnosis not present

## 2019-04-19 DIAGNOSIS — Z83518 Family history of other specified eye disorder: Secondary | ICD-10-CM | POA: Diagnosis not present

## 2019-04-19 DIAGNOSIS — S0502XD Injury of conjunctiva and corneal abrasion without foreign body, left eye, subsequent encounter: Secondary | ICD-10-CM | POA: Diagnosis not present

## 2019-08-08 ENCOUNTER — Encounter: Payer: Self-pay | Admitting: Physician Assistant

## 2019-09-11 DIAGNOSIS — Z131 Encounter for screening for diabetes mellitus: Secondary | ICD-10-CM | POA: Diagnosis not present

## 2019-09-11 DIAGNOSIS — Z125 Encounter for screening for malignant neoplasm of prostate: Secondary | ICD-10-CM | POA: Diagnosis not present

## 2019-09-11 DIAGNOSIS — R5383 Other fatigue: Secondary | ICD-10-CM | POA: Diagnosis not present

## 2019-09-11 DIAGNOSIS — Z13220 Encounter for screening for lipoid disorders: Secondary | ICD-10-CM | POA: Diagnosis not present

## 2019-09-12 LAB — CBC WITH DIFFERENTIAL/PLATELET
Absolute Monocytes: 292 cells/uL (ref 200–950)
Basophils Absolute: 21 cells/uL (ref 0–200)
Basophils Relative: 0.4 %
Eosinophils Absolute: 90 cells/uL (ref 15–500)
Eosinophils Relative: 1.7 %
HCT: 50 % (ref 38.5–50.0)
Hemoglobin: 17 g/dL (ref 13.2–17.1)
Lymphs Abs: 1617 cells/uL (ref 850–3900)
MCH: 29.5 pg (ref 27.0–33.0)
MCHC: 34 g/dL (ref 32.0–36.0)
MCV: 86.8 fL (ref 80.0–100.0)
MPV: 12 fL (ref 7.5–12.5)
Monocytes Relative: 5.5 %
Neutro Abs: 3281 cells/uL (ref 1500–7800)
Neutrophils Relative %: 61.9 %
Platelets: 146 10*3/uL (ref 140–400)
RBC: 5.76 10*6/uL (ref 4.20–5.80)
RDW: 12.9 % (ref 11.0–15.0)
Total Lymphocyte: 30.5 %
WBC: 5.3 10*3/uL (ref 3.8–10.8)

## 2019-09-12 LAB — COMPLETE METABOLIC PANEL WITH GFR
AG Ratio: 2 (calc) (ref 1.0–2.5)
ALT: 19 U/L (ref 9–46)
AST: 17 U/L (ref 10–35)
Albumin: 4.2 g/dL (ref 3.6–5.1)
Alkaline phosphatase (APISO): 63 U/L (ref 35–144)
BUN: 16 mg/dL (ref 7–25)
CO2: 26 mmol/L (ref 20–32)
Calcium: 9.4 mg/dL (ref 8.6–10.3)
Chloride: 106 mmol/L (ref 98–110)
Creat: 1.08 mg/dL (ref 0.70–1.33)
GFR, Est African American: 90 mL/min/{1.73_m2} (ref >=60)
GFR, Est Non African American: 77 mL/min/{1.73_m2} (ref >=60)
Globulin: 2.1 g/dL (calc) (ref 1.9–3.7)
Glucose, Bld: 93 mg/dL (ref 65–99)
Potassium: 4.3 mmol/L (ref 3.5–5.3)
Sodium: 139 mmol/L (ref 135–146)
Total Bilirubin: 0.7 mg/dL (ref 0.2–1.2)
Total Protein: 6.3 g/dL (ref 6.1–8.1)

## 2019-09-12 LAB — TSH: TSH: 1.22 mIU/L (ref 0.40–4.50)

## 2019-09-12 LAB — LIPID PANEL W/REFLEX DIRECT LDL
Cholesterol: 211 mg/dL — ABNORMAL HIGH (ref <200)
HDL: 46 mg/dL (ref >=40)
LDL Cholesterol (Calc): 148 mg/dL (calc) — ABNORMAL HIGH
Non-HDL Cholesterol (Calc): 165 mg/dL (calc) — ABNORMAL HIGH (ref <130)
Total CHOL/HDL Ratio: 4.6 (calc) (ref <5.0)
Triglycerides: 72 mg/dL (ref <150)

## 2019-09-12 LAB — VITAMIN D 25 HYDROXY (VIT D DEFICIENCY, FRACTURES): Vit D, 25-Hydroxy: 64 ng/mL (ref 30–100)

## 2019-09-12 LAB — PSA: PSA: 0.4 ng/mL (ref <=4.0)

## 2019-09-12 LAB — VITAMIN B12: Vitamin B-12: 561 pg/mL (ref 200–1100)

## 2019-09-13 NOTE — Progress Notes (Signed)
Jason [name],   PSA great.  TSH great.  CBC looks great.  Vitamin D perfect.  B12 great.  Kidney, liver, glucose look great.  Cholesterol elevated with a CV 10 year risk of 9.4 percent. Above 7.5 it is recommended to start a statin. What are you thoughts? If you stopped smoking it would significantly decrease this risk.

## 2019-09-27 DIAGNOSIS — Z03818 Encounter for observation for suspected exposure to other biological agents ruled out: Secondary | ICD-10-CM | POA: Diagnosis not present

## 2019-09-27 DIAGNOSIS — Z20828 Contact with and (suspected) exposure to other viral communicable diseases: Secondary | ICD-10-CM | POA: Diagnosis not present

## 2019-11-05 DIAGNOSIS — M542 Cervicalgia: Secondary | ICD-10-CM | POA: Diagnosis not present

## 2019-11-05 DIAGNOSIS — R519 Headache, unspecified: Secondary | ICD-10-CM | POA: Diagnosis not present

## 2019-12-18 DIAGNOSIS — Z86010 Personal history of colonic polyps: Secondary | ICD-10-CM | POA: Diagnosis not present

## 2019-12-18 DIAGNOSIS — K219 Gastro-esophageal reflux disease without esophagitis: Secondary | ICD-10-CM | POA: Diagnosis not present

## 2019-12-18 DIAGNOSIS — R1310 Dysphagia, unspecified: Secondary | ICD-10-CM | POA: Diagnosis not present

## 2019-12-23 DIAGNOSIS — K635 Polyp of colon: Secondary | ICD-10-CM | POA: Diagnosis not present

## 2019-12-23 DIAGNOSIS — K222 Esophageal obstruction: Secondary | ICD-10-CM | POA: Diagnosis not present

## 2019-12-23 DIAGNOSIS — R1319 Other dysphagia: Secondary | ICD-10-CM | POA: Diagnosis not present

## 2019-12-23 DIAGNOSIS — Z86010 Personal history of colonic polyps: Secondary | ICD-10-CM | POA: Diagnosis not present

## 2019-12-23 DIAGNOSIS — K449 Diaphragmatic hernia without obstruction or gangrene: Secondary | ICD-10-CM | POA: Diagnosis not present

## 2020-02-04 DIAGNOSIS — G8929 Other chronic pain: Secondary | ICD-10-CM | POA: Diagnosis not present

## 2020-02-04 DIAGNOSIS — R519 Headache, unspecified: Secondary | ICD-10-CM | POA: Diagnosis not present

## 2020-03-14 DIAGNOSIS — Z20822 Contact with and (suspected) exposure to covid-19: Secondary | ICD-10-CM | POA: Diagnosis not present

## 2020-04-10 DIAGNOSIS — Z135 Encounter for screening for eye and ear disorders: Secondary | ICD-10-CM | POA: Diagnosis not present

## 2020-04-10 DIAGNOSIS — R519 Headache, unspecified: Secondary | ICD-10-CM | POA: Diagnosis not present

## 2020-05-06 DIAGNOSIS — R519 Headache, unspecified: Secondary | ICD-10-CM | POA: Diagnosis not present

## 2020-05-06 DIAGNOSIS — M542 Cervicalgia: Secondary | ICD-10-CM | POA: Diagnosis not present

## 2020-07-26 DIAGNOSIS — Z20822 Contact with and (suspected) exposure to covid-19: Secondary | ICD-10-CM | POA: Diagnosis not present

## 2020-10-24 DIAGNOSIS — S62002A Unspecified fracture of navicular [scaphoid] bone of left wrist, initial encounter for closed fracture: Secondary | ICD-10-CM | POA: Diagnosis not present

## 2020-10-24 DIAGNOSIS — W11XXXA Fall on and from ladder, initial encounter: Secondary | ICD-10-CM | POA: Diagnosis not present

## 2020-10-24 DIAGNOSIS — S300XXA Contusion of lower back and pelvis, initial encounter: Secondary | ICD-10-CM | POA: Diagnosis not present

## 2020-10-24 DIAGNOSIS — Y990 Civilian activity done for income or pay: Secondary | ICD-10-CM | POA: Diagnosis not present

## 2021-01-21 DIAGNOSIS — D126 Benign neoplasm of colon, unspecified: Secondary | ICD-10-CM | POA: Diagnosis not present

## 2021-01-21 DIAGNOSIS — R1319 Other dysphagia: Secondary | ICD-10-CM | POA: Diagnosis not present

## 2021-01-21 DIAGNOSIS — K219 Gastro-esophageal reflux disease without esophagitis: Secondary | ICD-10-CM | POA: Diagnosis not present

## 2021-04-21 ENCOUNTER — Encounter: Payer: Self-pay | Admitting: Physician Assistant

## 2021-04-21 ENCOUNTER — Telehealth (INDEPENDENT_AMBULATORY_CARE_PROVIDER_SITE_OTHER): Payer: BC Managed Care – PPO | Admitting: Physician Assistant

## 2021-04-21 VITALS — BP 115/81 | HR 94 | Ht 73.0 in | Wt 162.0 lb

## 2021-04-21 DIAGNOSIS — F17200 Nicotine dependence, unspecified, uncomplicated: Secondary | ICD-10-CM

## 2021-04-21 DIAGNOSIS — E782 Mixed hyperlipidemia: Secondary | ICD-10-CM

## 2021-04-21 DIAGNOSIS — Z125 Encounter for screening for malignant neoplasm of prostate: Secondary | ICD-10-CM

## 2021-04-21 DIAGNOSIS — Z0000 Encounter for general adult medical examination without abnormal findings: Secondary | ICD-10-CM | POA: Diagnosis not present

## 2021-04-21 DIAGNOSIS — R5383 Other fatigue: Secondary | ICD-10-CM

## 2021-04-21 DIAGNOSIS — Z716 Tobacco abuse counseling: Secondary | ICD-10-CM | POA: Diagnosis not present

## 2021-04-21 DIAGNOSIS — F411 Generalized anxiety disorder: Secondary | ICD-10-CM

## 2021-04-21 DIAGNOSIS — Z131 Encounter for screening for diabetes mellitus: Secondary | ICD-10-CM | POA: Diagnosis not present

## 2021-04-21 DIAGNOSIS — R0602 Shortness of breath: Secondary | ICD-10-CM

## 2021-04-21 DIAGNOSIS — Z122 Encounter for screening for malignant neoplasm of respiratory organs: Secondary | ICD-10-CM

## 2021-04-21 MED ORDER — BUPROPION HCL ER (SR) 100 MG PO TB12: 100.0000 mg | ORAL_TABLET | Freq: Two times a day (BID) | ORAL | 2 refills | Status: DC

## 2021-04-21 MED ORDER — VENLAFAXINE HCL ER 37.5 MG PO CP24: 37.5000 mg | ORAL_CAPSULE | Freq: Every day | ORAL | 3 refills | Status: DC

## 2021-04-21 MED ORDER — ALBUTEROL SULFATE HFA 108 (90 BASE) MCG/ACT IN AERS: 2.0000 | INHALATION_SPRAY | Freq: Four times a day (QID) | RESPIRATORY_TRACT | 0 refills | Status: DC | PRN

## 2021-04-24 DIAGNOSIS — R0602 Shortness of breath: Secondary | ICD-10-CM | POA: Insufficient documentation

## 2021-04-24 NOTE — Progress Notes (Signed)
..Virtual Visit via Video Note  I connected with Jason [name] on 04/21/2021 at  1:20 PM EDT by a video enabled telemedicine application and verified that I am speaking with the correct person using two identifiers.  Location: Patient: home Provider: clinic  .Marland KitchenParticipating in visit:  Patient: Jason [name] Provider: Tandy Gaw PA-C   I discussed the limitations of evaluation and management by telemedicine and the availability of in person appointments. The patient expressed understanding and agreed to proceed.  History of Present Illness: Pt is a 55 yo male anxiety who presents to the clinic for follow up and health prevention.   He is ready to quit smoking. He wants help. He is noticing more and more SOB and cough.   He would like blood work and screenings.   Anxiety is still an issue but effexor does help.   .. Active Ambulatory Problems    Diagnosis Date Noted   Anxiety, generalized 08/18/2011   Tobacco abuse 08/15/2012   BPH (benign prostatic hyperplasia) 10/15/2014   Hyperlipidemia 10/16/2014   Cognitive changes 03/19/2017   Weakness 03/19/2017   No energy 03/19/2017   Anxiety 03/19/2017   Dysthymia 03/19/2017   Neck pain, chronic 03/19/2017   Tension headache 04/29/2017   Enlarged prostate without lower urinary tract symptoms (luts) 10/15/2014   Generalized anxiety disorder 08/18/2011   Tobacco use 08/15/2012   Nasal congestion 05/17/2018   SOB (shortness of breath) 04/24/2021   Resolved Ambulatory Problems    Diagnosis Date Noted   No Resolved Ambulatory Problems   No Additional Past Medical History   .Marland Kitchen Family History  Problem Relation Age of Onset   Bipolar disorder Father    Heart attack Father 49       pacemaker, smoker   Alcohol abuse Paternal Uncle    Hypertension Unknown    Alcohol abuse Unknown      Observations/Objective: No acute distress Normal breathing Normal mood and appearance  .Marland Kitchen Today's Vitals   04/21/21 1311  BP: 115/81   Pulse: 94  Weight: 162 lb (73.5 kg)  Height: 6\' 1"  (1.854 m)   Body mass index is 21.37 kg/m.  .. Depression screen [facility] 2/9 04/21/2021 02/07/2019 05/16/2018 04/26/2017 03/14/2017  Decreased Interest 1 0 2 1 1   Down, Depressed, Hopeless 1 0 0 1 1  PHQ - 2 Score 2 0 2 2 2   Altered sleeping 1 - 1 - 3  Tired, decreased energy 1 - 1 2 2   Change in appetite 0 - 0 1 1  Feeling bad or failure about yourself  0 - 0 0 0  Trouble concentrating 1 - 0 1 1  Moving slowly or fidgety/restless 0 - 0 2 2  Suicidal thoughts 0 - 0 0 0  PHQ-9 Score 5 - 4 - 11  Difficult doing work/chores Somewhat difficult - Somewhat difficult - -      Assessment and Plan: 03/16/2017 Jason [name] was seen today for annual exam.  Diagnoses and all orders for this visit:  Routine physical examination -     Testosterone Total,Free,Bio, Males -     PSA -     COMPLETE METABOLIC PANEL WITH GFR -     Lipid Panel w/reflex Direct LDL -     CBC with Differential/Platelet -     Ambulatory Referral for Lung Cancer Scre  Encounter for smoking cessation counseling -     buPROPion ER (WELLBUTRIN SR) 100 MG 12 hr tablet; Take 1 tablet (100 mg total) by mouth 2 (two) times  daily.  Mixed hyperlipidemia -     Lipid Panel w/reflex Direct LDL  Screening for diabetes mellitus -     COMPLETE METABOLIC PANEL WITH GFR  Prostate cancer screening -     PSA  No energy -     Testosterone Total,Free,Bio, Males -     CBC with Differential/Platelet  Current smoker -     Ambulatory Referral for Lung Cancer Scre  Screening for lung cancer -     Ambulatory Referral for Lung Cancer Scre  Anxiety, generalized -     venlafaxine XR (EFFEXOR-XR) 37.5 MG 24 hr capsule; Take 1 capsule (37.5 mg total) by mouth daily.  Other orders -     albuterol (VENTOLIN HFA) 108 (90 Base) MCG/ACT inhaler; Inhale 2 puffs into the lungs every 6 (six) hours as needed.   Marland Kitchen.Start a regular exercise program and make sure you are eating a healthy diet Try to eat 4  servings of dairy a day or take a calcium supplement (500mg  twice a day). Fasting labs ordered.  Colonoscopy UTD.  PSA ordered.  Covid vaccine x3 Needs flu shot and shingrix.   Discussed smoking cessation.  Start wellbutrin.  Low dose CT scan ordered for screening.  Pt older than 50, Smoked 34 years at least a pack a day. Down to 17 cigs a day now.  Albuterol given for as needed usage.  Consider appt for spirometry to assess lung function.      Follow Up Instructions:    I discussed the assessment and treatment plan with the patient. The patient was provided an opportunity to ask questions and all were answered. The patient agreed with the plan and demonstrated an understanding of the instructions.   The patient was advised to call back or seek an in-person evaluation if the symptoms worsen or if the condition fails to improve as anticipated.     , PA-C

## 2021-04-24 NOTE — Patient Instructions (Signed)
Needs spirometry.  

## 2021-04-29 DIAGNOSIS — Z125 Encounter for screening for malignant neoplasm of prostate: Secondary | ICD-10-CM | POA: Diagnosis not present

## 2021-04-29 DIAGNOSIS — Z131 Encounter for screening for diabetes mellitus: Secondary | ICD-10-CM | POA: Diagnosis not present

## 2021-04-29 DIAGNOSIS — E782 Mixed hyperlipidemia: Secondary | ICD-10-CM | POA: Diagnosis not present

## 2021-04-29 DIAGNOSIS — R5383 Other fatigue: Secondary | ICD-10-CM | POA: Diagnosis not present

## 2021-04-30 LAB — CBC WITH DIFFERENTIAL/PLATELET
Absolute Monocytes: 396 cells/uL (ref 200–950)
Basophils Absolute: 44 cells/uL (ref 0–200)
Basophils Relative: 0.5 %
Eosinophils Absolute: 79 cells/uL (ref 15–500)
Eosinophils Relative: 0.9 %
HCT: 49.7 % (ref 38.5–50.0)
Hemoglobin: 16.8 g/dL (ref 13.2–17.1)
Lymphs Abs: 2050 cells/uL (ref 850–3900)
MCH: 30.1 pg (ref 27.0–33.0)
MCHC: 33.8 g/dL (ref 32.0–36.0)
MCV: 88.9 fL (ref 80.0–100.0)
MPV: 11.7 fL (ref 7.5–12.5)
Monocytes Relative: 4.5 %
Neutro Abs: 6230 cells/uL (ref 1500–7800)
Neutrophils Relative %: 70.8 %
Platelets: 166 10*3/uL (ref 140–400)
RBC: 5.59 10*6/uL (ref 4.20–5.80)
RDW: 12.2 % (ref 11.0–15.0)
Total Lymphocyte: 23.3 %
WBC: 8.8 10*3/uL (ref 3.8–10.8)

## 2021-04-30 LAB — COMPLETE METABOLIC PANEL WITH GFR
AG Ratio: 1.7 (calc) (ref 1.0–2.5)
ALT: 13 U/L (ref 9–46)
AST: 16 U/L (ref 10–35)
Albumin: 4 g/dL (ref 3.6–5.1)
Alkaline phosphatase (APISO): 71 U/L (ref 35–144)
BUN: 12 mg/dL (ref 7–25)
CO2: 25 mmol/L (ref 20–32)
Calcium: 9 mg/dL (ref 8.6–10.3)
Chloride: 106 mmol/L (ref 98–110)
Creat: 1.1 mg/dL (ref 0.70–1.30)
Globulin: 2.3 g/dL (calc) (ref 1.9–3.7)
Glucose, Bld: 100 mg/dL — ABNORMAL HIGH (ref 65–99)
Potassium: 4 mmol/L (ref 3.5–5.3)
Sodium: 139 mmol/L (ref 135–146)
Total Bilirubin: 0.6 mg/dL (ref 0.2–1.2)
Total Protein: 6.3 g/dL (ref 6.1–8.1)
eGFR: 79 mL/min/{1.73_m2} (ref >=60)

## 2021-04-30 LAB — LIPID PANEL W/REFLEX DIRECT LDL
Cholesterol: 202 mg/dL — ABNORMAL HIGH (ref <200)
HDL: 44 mg/dL (ref >=40)
LDL Cholesterol (Calc): 139 mg/dL (calc) — ABNORMAL HIGH
Non-HDL Cholesterol (Calc): 158 mg/dL (calc) — ABNORMAL HIGH (ref <130)
Total CHOL/HDL Ratio: 4.6 (calc) (ref <5.0)
Triglycerides: 86 mg/dL (ref <150)

## 2021-04-30 LAB — TESTOSTERONE TOTAL,FREE,BIO, MALES
Albumin: 4 g/dL (ref 3.6–5.1)
Sex Hormone Binding: 52 nmol/L — ABNORMAL HIGH (ref 10–50)
Testosterone, Bioavailable: 131 ng/dL (ref 110.0–575.0)
Testosterone, Free: 71.2 pg/mL (ref 46.0–224.0)
Testosterone: 725 ng/dL (ref 250–827)

## 2021-04-30 LAB — PSA: PSA: 0.58 ng/mL (ref <=4.00)

## 2021-05-03 NOTE — Progress Notes (Signed)
Jayen,   Testosterone looks great.  PSA stable.  Kidney, liver look amazing.  Glucose is 100 under 100 for fasting sugars is normal. Lets get A1C to further evaluate.  CBC looks great.  Cholesterol stable from 1 year ago but 10 year risk of CV event is 10.5 percent. Strongly recommend a statin and continue working on smoking cessation.

## 2021-08-02 DIAGNOSIS — R519 Headache, unspecified: Secondary | ICD-10-CM | POA: Diagnosis not present

## 2021-08-02 DIAGNOSIS — M542 Cervicalgia: Secondary | ICD-10-CM | POA: Diagnosis not present

## 2021-08-17 ENCOUNTER — Encounter: Payer: Self-pay | Admitting: Physician Assistant

## 2021-08-17 DIAGNOSIS — G8929 Other chronic pain: Secondary | ICD-10-CM

## 2021-08-17 DIAGNOSIS — M542 Cervicalgia: Secondary | ICD-10-CM

## 2021-08-20 ENCOUNTER — Other Ambulatory Visit: Payer: Self-pay

## 2021-08-20 ENCOUNTER — Ambulatory Visit (INDEPENDENT_AMBULATORY_CARE_PROVIDER_SITE_OTHER): Payer: BC Managed Care – PPO

## 2021-08-20 DIAGNOSIS — G8929 Other chronic pain: Secondary | ICD-10-CM | POA: Diagnosis not present

## 2021-08-20 DIAGNOSIS — M542 Cervicalgia: Secondary | ICD-10-CM

## 2021-08-23 ENCOUNTER — Encounter: Payer: Self-pay | Admitting: Physician Assistant

## 2021-08-23 DIAGNOSIS — M5030 Other cervical disc degeneration, unspecified cervical region: Secondary | ICD-10-CM | POA: Insufficient documentation

## 2021-08-23 NOTE — Progress Notes (Signed)
Lots of degenerative changes in cervical spine. Some narrowing in spine as well. All of this could be causing pain. Lets have a virtual visit to discuss a few medications and then get you in with physical therapy. If no improvement will need to follow up with Dr. Karie Schwalbe to discuss more and consider injections.

## 2021-08-25 ENCOUNTER — Encounter: Payer: Self-pay | Admitting: Physician Assistant

## 2021-08-25 ENCOUNTER — Telehealth (INDEPENDENT_AMBULATORY_CARE_PROVIDER_SITE_OTHER): Payer: BC Managed Care – PPO | Admitting: Physician Assistant

## 2021-08-25 VITALS — Ht 73.0 in | Wt 162.0 lb

## 2021-08-25 DIAGNOSIS — M5030 Other cervical disc degeneration, unspecified cervical region: Secondary | ICD-10-CM | POA: Diagnosis not present

## 2021-08-25 DIAGNOSIS — G8929 Other chronic pain: Secondary | ICD-10-CM | POA: Diagnosis not present

## 2021-08-25 DIAGNOSIS — M542 Cervicalgia: Secondary | ICD-10-CM

## 2021-08-25 MED ORDER — MELOXICAM 15 MG PO TABS: 15.0000 mg | ORAL_TABLET | Freq: Every day | ORAL | 1 refills | Status: DC

## 2021-08-25 NOTE — Progress Notes (Signed)
..  Virtual Visit via Video Note  I connected with Jason [name] on 08/25/21 at  7:30 AM EST by a video enabled telemedicine application and verified that I am speaking with the correct person using two identifiers.  Location: Patient: home Provider: clinic  .Marland KitchenParticipating in visit:  Patient: Jason [name] Provider: Tandy Gaw PA-C Provider in training: Rolland Porter   I discussed the limitations of evaluation and management by telemedicine and the availability of in person appointments. The patient expressed understanding and agreed to proceed.  History of Present Illness: Pt is a 56 yo male with hx if neck pain who calls into the clinic "ready to do something about it". He does not like to take medications and really just occasional takes tylenol or ibuprofen but ibuprofen seems to help more. No injury to neck. Pt has a very physical job with lifting. At times there is some numbness and tingling that radiate into both arms and hands but seems to go away with stretching and position changes. No strength changes. He does have a lot of muscle tightness.   .. Active Ambulatory Problems    Diagnosis Date Noted   Anxiety, generalized 08/18/2011   Tobacco abuse 08/15/2012   BPH (benign prostatic hyperplasia) 10/15/2014   Hyperlipidemia 10/16/2014   Cognitive changes 03/19/2017   Weakness 03/19/2017   No energy 03/19/2017   Anxiety 03/19/2017   Dysthymia 03/19/2017   Neck pain, chronic 03/19/2017   Tension headache 04/29/2017   Enlarged prostate without lower urinary tract symptoms (luts) 10/15/2014   Generalized anxiety disorder 08/18/2011   Tobacco use 08/15/2012   Nasal congestion 05/17/2018   SOB (shortness of breath) 04/24/2021   Degenerative disc disease, cervical 08/23/2021   Resolved Ambulatory Problems    Diagnosis Date Noted   No Resolved Ambulatory Problems   No Additional Past Medical History       Observations/Objective: No acute distress Normal mood and  appearance NROM of neck  .Marland Kitchen Today's Vitals   08/25/21 0715  Weight: 162 lb (73.5 kg)  Height: 6\' 1"  (1.854 m)   Body mass index is 21.37 kg/m.    Assessment and Plan: Marland KitchenHolton was seen today for follow-up.  Diagnoses and all orders for this visit:  Neck pain, chronic -     meloxicam (MOBIC) 15 MG tablet; Take 1 tablet (15 mg total) by mouth daily. -     Ambulatory referral to Physical Therapy  Degenerative disc disease, cervical -     meloxicam (MOBIC) 15 MG tablet; Take 1 tablet (15 mg total) by mouth daily. -     Ambulatory referral to Physical Therapy   Pt has cervical DDD with reason for neck pain. Start PT. Ordered placed.  Get a cervical supported pillow.  Start mobic daily. Discussed to take with food and watch out for GI side effects.  If no improvement consider Dr. Zollie Beckers for more work up and aggressive therapy such as injections.    Follow Up Instructions:    I discussed the assessment and treatment plan with the patient. The patient was provided an opportunity to ask questions and all were answered. The patient agreed with the plan and demonstrated an understanding of the instructions.   The patient was advised to call back or seek an in-person evaluation if the symptoms worsen or if the condition fails to improve as anticipated.   Karie Schwalbe, PA-C

## 2021-08-25 NOTE — Progress Notes (Signed)
Jomarie Longs, PA-C  08/23/2021  6:24 AM EST     Lots of degenerative changes in cervical spine. Some narrowing in spine as well. All of this could be causing pain. Lets have a virtual visit to discuss a few medications and then get you in with physical therapy. If no improvement will need to follow up with Dr. Karie Schwalbe to discuss more and consider injections.    Hurts down shoulders more right than Very physci Wake up numbness all of them.

## 2021-09-16 ENCOUNTER — Ambulatory Visit: Payer: BC Managed Care – PPO | Attending: Physician Assistant | Admitting: Physical Therapy

## 2021-09-16 ENCOUNTER — Other Ambulatory Visit: Payer: Self-pay

## 2021-09-16 DIAGNOSIS — M542 Cervicalgia: Secondary | ICD-10-CM | POA: Diagnosis not present

## 2021-09-16 DIAGNOSIS — M5030 Other cervical disc degeneration, unspecified cervical region: Secondary | ICD-10-CM | POA: Insufficient documentation

## 2021-09-16 DIAGNOSIS — M6281 Muscle weakness (generalized): Secondary | ICD-10-CM

## 2021-09-16 DIAGNOSIS — G8929 Other chronic pain: Secondary | ICD-10-CM | POA: Diagnosis not present

## 2021-09-16 DIAGNOSIS — R293 Abnormal posture: Secondary | ICD-10-CM

## 2021-09-16 NOTE — Therapy (Signed)
[facility] [place] [address] [place], [place], [address] Phone: [phone]   Fax:  [phone]  Physical Therapy Evaluation  Patient Details  Name: Jason [name] MRN: [id number] Date of Birth: [birth year] Referring Provider (PT): Jomarie LongsBreeback, Jade L, [place]-C   Encounter Date: 09/16/2021   PT End of Session - 09/16/21 1743     Visit Number 1    Number of Visits 8    Date for PT Re-Evaluation 11/11/21    Authorization Type BCBS    PT Start Time 1655    PT Stop Time 1740    PT Time Calculation (min) 45 min    Activity Tolerance Patient tolerated treatment well    Behavior During Therapy [facility] for tasks assessed/performed             Past Medical History:  Diagnosis Date   Anxiety    BPH (benign prostatic hyperplasia) 10/15/2014   Hyperlipidemia 10/16/2014   AHA 10 year risk of 2.8% March 2016     No past surgical history on file.  There were no vitals filed for this visit.    Subjective Assessment - 09/16/21 1657     Subjective Pt reports increased neck pain. This has been ongoing for years. Pt reports N/T in his hands in the morning on and off the last 3 years. Pt states it starts to carry though into his shoulder blades. It can vary in pain or in both sides. Pt does stretches every once a while. Pt states he got a new pillow that has more neck support -- he is still getting acclimated to it but is noticing less tingling in the morning.    Limitations Lifting;Other (comment)    How long can you sit comfortably? n/a    How long can you stand comfortably? n/a    How long can you walk comfortably? n/a    Diagnostic tests Cervical x-ray: Mild degenerative disc and degenerative joint disease resulting in  multilevel mild neuroforaminal narrowing as described above.    Patient Stated Goals A little bit of relief with those times    Currently in Pain? Yes    Pain Score 1    At worst 4/10   Pain Location Neck    Pain  Orientation Mid    Pain Descriptors / Indicators Burning;Aching    Pain Type Chronic pain    Pain Radiating Towards At times in bilat scapula    Pain Onset More than a month ago    Pain Frequency Constant    Aggravating Factors  Running chain saw, shovel, more active shoulder movements    Pain Relieving Factors tylenol if it gets bad or cause headache    Effect of Pain on Daily Activities Difficulty with lifting over head, work activities                [facility] Assessment - 09/16/21 0001       Assessment   Medical Diagnosis M54.2,G89.29 (ICD-10-CM) - Neck pain, chronic  M50.30 (ICD-10-CM) - Degenerative disc disease, cervical    Referring Provider (PT) Breeback, Jade L, PA-C    Hand Dominance Right    Prior Therapy None      Precautions   Precautions None      Balance Screen   Has the patient fallen in the past 6 months No      Home Environment   Living Environment Private residence    Living Arrangements Spouse/significant other;Children  Prior Function   Vocation Full time employment    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, yard work      Observation/Other Assessments   Focus on Therapeutic Outcomes (FOTO)  53 (risk adjusted 55); predicted 64 at visit 11      Posture/Postural Control   Posture/Postural Control Postural limitations    Postural Limitations Decreased thoracic kyphosis;Rounded Shoulders      ROM / Strength   AROM / PROM / Strength AROM;Strength      AROM   AROM Assessment Site Cervical    Cervical Flexion 65    Cervical Extension 50    Cervical - Right Side Bend 40   increased pain   Cervical - Left Side Bend 45   increased pain   Cervical - Right Rotation 50    Cervical - Left Rotation 55      Strength   Overall Strength Comments low trap "Y" in supine 3/5    Strength Assessment Site Shoulder    Right/Left Shoulder Right;Left    Right Shoulder Flexion 4-/5    Right Shoulder Extension 4/5    Right  Shoulder ABduction 4-/5    Right Shoulder Internal Rotation 4+/5    Right Shoulder External Rotation 4-/5    Right Shoulder Horizontal ABduction 3+/5   in supine   Left Shoulder Flexion 4-/5    Left Shoulder Extension 4/5    Left Shoulder ABduction 4-/5    Left Shoulder Internal Rotation 4+/5    Left Shoulder External Rotation 4-/5    Left Shoulder Horizontal ABduction 3+/5   in supine     Palpation   Spinal mobility hypermobile upper cervical; hypomobile mid cervical (C3-C5)    Palpation comment TTP and taut bilat pecs, UTs, cervical paraspinals      Special Tests    Special Tests Cervical    Cervical Tests Dictraction;Spurling's      Spurling's   Findings Negative      Distraction Test   Findngs Negative    Comment Feels good                        Objective measurements completed on examination: See above findings.       [facility] Adult PT Treatment/Exercise - 09/16/21 0001       Exercises   Exercises Shoulder      Shoulder Exercises: Standing   Horizontal ABduction Strengthening;Both;10 reps;Theraband    Theraband Level (Shoulder Horizontal ABduction) Level 3 (Green)    External Rotation Strengthening;Both;20 reps;Theraband    Theraband Level (Shoulder External Rotation) Level 3 (Green)    Row Strengthening;Both;20 reps;Theraband    Theraband Level (Shoulder Row) Level 3 (Green)      Shoulder Exercises: Stretch   Other Shoulder Stretches Doorway pec stretch low, mid, high x30 sec each                     PT Education - 09/16/21 1745     Education Details Discussed exam findings, POC, and HEP    Person(s) Educated Patient    Methods Explanation;Demonstration;Tactile cues;Verbal cues;Handout    Comprehension Verbalized understanding;Returned demonstration;Verbal cues required;Tactile cues required              PT Short Term Goals - 09/16/21 1757       PT SHORT TERM GOAL #1   Title STGs = LTGs               PT  Long  Term Goals - 09/16/21 1758       PT LONG TERM GOAL #1   Title Pt will be independent with HEP    Time 8    Period Weeks    Status New    Target Date 11/11/21      PT LONG TERM GOAL #2   Title Pt will demo improved posterior shoulder girdle strength to at least 4+/5 for improved cervicothoracic stability    Time 8    Period Weeks    Status New    Target Date 11/11/21      PT LONG TERM GOAL #3   Title Pt will be able to lift at least 10# overhead for work activities    Baseline Unable    Time 8    Period Weeks    Status New    Target Date 11/11/21      PT LONG TERM GOAL #4   Title Pt will report decrease in pain by >/=25% with no N/T    Baseline 4/10 at worst    Time 8    Period Weeks    Status New    Target Date 11/11/21      PT LONG TERM GOAL #5   Title Pt will have improved FOTO to 64    Baseline 53    Time 8    Period Weeks    Status New    Target Date 11/11/21                    Plan - 09/16/21 1745     Clinical Impression Statement Jason [name] is a 56 y/o M presenting to OPPT due to chronic neck pain which can at times lead to shoulder/scapular pain. On assessment, pt demos limitations in cervical ROM (with hypomobile mid cervical spine and throughout thoracic spine), general shoulder weakness especially along posterior shoulder girdle/RTC with taut/tight pecs, UTs, cervical paraspinals. No N/T noted this session but pt will complain of this at times especially in the morning. Pt works a very Advertising account executive job. Pt would highly benefit from PT to improve his postural strength and mechanics for decreased overall pain. Due to work and clinic scheduling pt only able to come 1x/wk.    Personal Factors and Comorbidities Age;Fitness;Time since onset of injury/illness/exacerbation;Profession    Examination-Activity Limitations Reach Overhead;Carry;Lift;Sleep    Examination-Participation Restrictions Community Activity;Occupation;Yard Work    Stability/Clinical  Decision Making Stable/Uncomplicated    Clinical Decision Making Low    Rehab Potential Good    PT Frequency 1x / week    PT Duration 8 weeks    PT Treatment/Interventions ADLs/Self Care Home Management;Cryotherapy;Electrical Stimulation;Aquatic Therapy;Iontophoresis 4mg /ml Dexamethasone;Moist Heat;Traction;DME Instruction;Functional mobility training;Therapeutic activities;Therapeutic exercise;Neuromuscular re-education;Patient/family education;Manual techniques;Passive range of motion;Dry needling;Taping    PT Next Visit Plan Trial traction (pt had asked about it). Manual work or TPDN as needed. Continue posterior shoulder girdle strengthening (especially mid/low trap and RTC)    PT Home Exercise Plan Access Code [email or web address]    Consulted and Agree with Plan of Care Patient             Patient will benefit from skilled therapeutic intervention in order to improve the following deficits and impairments:  Increased fascial restricitons, Decreased range of motion, Increased muscle spasms, Impaired UE functional use, Pain, Hypomobility, Decreased mobility, Decreased strength, Postural dysfunction  Visit Diagnosis: Muscle weakness (generalized)  Cervicalgia  Abnormal posture     Problem List Patient Active Problem List   Diagnosis  Date Noted   Degenerative disc disease, cervical 08/23/2021   SOB (shortness of breath) 04/24/2021   Nasal congestion 05/17/2018   Tension headache 04/29/2017   Cognitive changes 03/19/2017   Weakness 03/19/2017   No energy 03/19/2017   Anxiety 03/19/2017   Dysthymia 03/19/2017   Neck pain, chronic 03/19/2017   Hyperlipidemia 10/16/2014   BPH (benign prostatic hyperplasia) 10/15/2014   Enlarged prostate without lower urinary tract symptoms (luts) 10/15/2014   Tobacco abuse 08/15/2012   Tobacco use 08/15/2012   Anxiety, generalized 08/18/2011   Generalized anxiety disorder 08/18/2011    [facility] [place], DPT 09/16/2021, 6:02 PM  [facility] [address] [place], [place], [address] Phone: [phone]   Fax:  [phone]  Name: Jason [name] MRN: [id number] Date of Birth: [birth year]

## 2021-09-20 DIAGNOSIS — L291 Pruritus scroti: Secondary | ICD-10-CM | POA: Diagnosis not present

## 2021-09-20 DIAGNOSIS — L814 Other melanin hyperpigmentation: Secondary | ICD-10-CM | POA: Diagnosis not present

## 2021-09-20 DIAGNOSIS — L821 Other seborrheic keratosis: Secondary | ICD-10-CM | POA: Diagnosis not present

## 2021-09-30 ENCOUNTER — Ambulatory Visit: Payer: BC Managed Care – PPO | Admitting: Physical Therapy

## 2021-09-30 ENCOUNTER — Other Ambulatory Visit: Payer: Self-pay

## 2021-09-30 DIAGNOSIS — M542 Cervicalgia: Secondary | ICD-10-CM | POA: Diagnosis not present

## 2021-09-30 DIAGNOSIS — M5030 Other cervical disc degeneration, unspecified cervical region: Secondary | ICD-10-CM | POA: Diagnosis not present

## 2021-09-30 DIAGNOSIS — G8929 Other chronic pain: Secondary | ICD-10-CM | POA: Diagnosis not present

## 2021-09-30 NOTE — Therapy (Signed)
[place] [facility] ?[address] [place] [address] ?[place], [place], [address] ?Phone: [phone]   Fax:  [phone] ? ?Physical Therapy Treatment ? ?Patient Details  ?Name: Jason [name] ?MRN: [id number] ?Date of Birth: [birth year] ?Referring Provider (PT): Jomarie Longs, PA-C ? ? ?Encounter Date: 09/30/2021 ? ? PT End of Session - 09/30/21 1702   ? ? Visit Number 2   ? Number of Visits 8   ? Date for PT Re-Evaluation 11/11/21   ? Authorization Type BCBS   ? PT Start Time 1702   ? PT Stop Time 1745   ? PT Time Calculation (min) 43 min   ? Activity Tolerance Patient tolerated treatment well   ? Behavior During Therapy [facility] for tasks assessed/performed   ? ?  ?  ? ?  ? ? ?Past Medical History:  ?Diagnosis Date  ? Anxiety   ? BPH (benign prostatic hyperplasia) 10/15/2014  ? Hyperlipidemia 10/16/2014  ? AHA 10 year risk of 2.8% March 2016   ? ? ?No past surgical history on file. ? ?There were no vitals filed for this visit. ? ? Subjective Assessment - 09/30/21 1703   ? ? Subjective Pt reports he's just been working. Has been able to do exercises every day. Reports green band has been getting easier   ? Limitations Lifting;Other (comment)   ? How long can you sit comfortably? n/a   ? How long can you stand comfortably? n/a   ? How long can you walk comfortably? n/a   ? Diagnostic tests Cervical x-ray: Mild degenerative disc and degenerative joint disease resulting in  multilevel mild neuroforaminal narrowing as described above.   ? Patient Stated Goals A little bit of relief with those times   ? Currently in Pain? Yes   ? Pain Score 3    ? Pain Location Neck   ? Pain Onset More than a month ago   ? ?  ?  ? ?  ? ? ? ? ? ? ? ? ? ? ? ? ? ? ? ? ? ? ? ? [facility] Adult PT Treatment/Exercise - 09/30/21 0001   ? ?  ? Shoulder Exercises: Prone  ? Other Prone Exercises "I", "W", "Y", 2x10 each   ?  ? Shoulder Exercises: Standing  ? Flexion AROM;10 reps   ? Flexion Limitations no popping noted   ?  ABduction Limitations popping noted   ? Diagonals --   Attempted with green and red tband but pt with increased shoulder popping  ? Shoulder Elevation Limitations shoulders pop in scaption   ?  ? Manual Therapy  ? Manual Therapy Soft tissue mobilization;Myofascial release   ? Manual therapy comments skilled assessment and palpation for TPDN and STM   ? Soft tissue mobilization STM & TPR cervical paraspinals, suboccipitals, UTs bilat   ? Myofascial Release UTs   ? ?  ?  ? ?  ? ? ? Trigger Point Dry Needling - 09/30/21 0001   ? ? Consent Given? Yes   ? Education Handout Provided Yes   ? Muscles Treated Head and Neck Upper trapezius;Suboccipitals;Cervical multifidi;Oblique capitus   ? Upper Trapezius Response Twitch reponse elicited;Palpable increased muscle length   ? Oblique Capitus Response Twitch response elicited;Palpable increased muscle length   ? Suboccipitals Response Twitch response elicited;Palpable increased muscle length   ? Cervical multifidi Response Palpable increased muscle length;Twitch reponse elicited   ? ?  ?  ? ?  ? ? ? ? ? ? ? ? ? ?  PT Short Term Goals - 09/16/21 1757   ? ?  ? PT SHORT TERM GOAL #1  ? Title STGs = LTGs   ? ?  ?  ? ?  ? ? ? ? PT Long Term Goals - 09/16/21 1758   ? ?  ? PT LONG TERM GOAL #1  ? Title Pt will be independent with HEP   ? Time 8   ? Period Weeks   ? Status New   ? Target Date 11/11/21   ?  ? PT LONG TERM GOAL #2  ? Title Pt will demo improved posterior shoulder girdle strength to at least 4+/5 for improved cervicothoracic stability   ? Time 8   ? Period Weeks   ? Status New   ? Target Date 11/11/21   ?  ? PT LONG TERM GOAL #3  ? Title Pt will be able to lift at least 10# overhead for work activities   ? Baseline Unable   ? Time 8   ? Period Weeks   ? Status New   ? Target Date 11/11/21   ?  ? PT LONG TERM GOAL #4  ? Title Pt will report decrease in pain by >/=25% with no N/T   ? Baseline 4/10 at worst   ? Time 8   ? Period Weeks   ? Status New   ? Target Date 11/11/21    ?  ? PT LONG TERM GOAL #5  ? Title Pt will have improved FOTO to 64   ? Baseline 53   ? Time 8   ? Period Weeks   ? Status New   ? Target Date 11/11/21   ? ?  ?  ? ?  ? ? ? ? ? ? ? ? Plan - 09/30/21 1745   ? ? Clinical Impression Statement Treatment session focused primarily on strengthening posterior shoulder girdle. Attempted in standing with tband; however, pt with shoulder popping during any shoulder elevation. Performed in prone with improved stability. Trialed TPDN to address pt's neck tightness with increased ROM noted after.   ? Personal Factors and Comorbidities Age;Fitness;Time since onset of injury/illness/exacerbation;Profession   ? Examination-Activity Limitations Reach Overhead;Carry;Lift;Sleep   ? Examination-Participation Restrictions Community Activity;Occupation;Pincus Badder Work   ? Stability/Clinical Decision Making Stable/Uncomplicated   ? Rehab Potential Good   ? PT Frequency 1x / week   ? PT Duration 8 weeks   ? PT Treatment/Interventions ADLs/Self Care Home Management;Cryotherapy;Electrical Stimulation;Aquatic Therapy;Iontophoresis 4mg /ml Dexamethasone;Moist Heat;Traction;DME Instruction;Functional mobility training;Therapeutic activities;Therapeutic exercise;Neuromuscular re-education;Patient/family education;Manual techniques;Passive range of motion;Dry needling;Taping   ? PT Next Visit Plan Trial traction (pt had asked about it). Manual work or TPDN as needed. Continue posterior shoulder girdle strengthening (especially mid/low trap and RTC). Continue to work on scapular stabilization   ? PT Home Exercise Plan Access Code [email or web address]   ? Consulted and Agree with Plan of Care Patient   ? ?  ?  ? ?  ? ? ?Patient will benefit from skilled therapeutic intervention in order to improve the following deficits and impairments:  Increased fascial restricitons, Decreased range of motion, Increased muscle spasms, Impaired UE functional use, Pain, Hypomobility, Decreased mobility, Decreased strength, Postural  dysfunction ? ?Visit Diagnosis: ?Muscle weakness (generalized) ? ?Cervicalgia ? ?Abnormal posture ? ? ? ? ?Problem List ?Patient Active Problem List  ? Diagnosis Date Noted  ? Degenerative disc disease, cervical 08/23/2021  ? SOB (shortness of breath) 04/24/2021  ? Nasal congestion 05/17/2018  ? Tension headache 04/29/2017  ? Cognitive  changes 03/19/2017  ? Weakness 03/19/2017  ? No energy 03/19/2017  ? Anxiety 03/19/2017  ? Dysthymia 03/19/2017  ? Neck pain, chronic 03/19/2017  ? Hyperlipidemia 10/16/2014  ? BPH (benign prostatic hyperplasia) 10/15/2014  ? Enlarged prostate without lower urinary tract symptoms (luts) 10/15/2014  ? Tobacco abuse 08/15/2012  ? Tobacco use 08/15/2012  ? Anxiety, generalized 08/18/2011  ? Generalized anxiety disorder 08/18/2011  ? ? ?Nuala Chiles April Dell PontoMa L Desiderio Dolata, PT, DPT ?09/30/2021, 5:51 PM ? ?[place] [facility] ?[address] ?[place], [place], [address] ?Phone: [phone]   Fax:  [phone] ? ?Name: Jason [name] ?MRN: [id number] ?Date of Birth: [birth year] ? ? ? ?

## 2021-09-30 NOTE — Patient Instructions (Signed)

## 2021-10-07 ENCOUNTER — Other Ambulatory Visit: Payer: Self-pay

## 2021-10-07 ENCOUNTER — Ambulatory Visit: Payer: BC Managed Care – PPO | Admitting: Physical Therapy

## 2021-10-07 DIAGNOSIS — M5030 Other cervical disc degeneration, unspecified cervical region: Secondary | ICD-10-CM | POA: Diagnosis not present

## 2021-10-07 DIAGNOSIS — M6281 Muscle weakness (generalized): Secondary | ICD-10-CM

## 2021-10-07 DIAGNOSIS — M542 Cervicalgia: Secondary | ICD-10-CM

## 2021-10-07 DIAGNOSIS — G8929 Other chronic pain: Secondary | ICD-10-CM | POA: Diagnosis not present

## 2021-10-07 DIAGNOSIS — R293 Abnormal posture: Secondary | ICD-10-CM

## 2021-10-07 NOTE — Therapy (Signed)
Jason [name] ?[facility] ?[address] ?[place], [place], [address] ?Phone: [phone]   Fax:  [phone] ? ?Physical Therapy Treatment ? ?Patient Details  ?Name: Jason [name] ?MRN: [id number] ?Date of Birth: [birth year] ?Referring Provider (PT): Jomarie LongsBreeback, Jade L, PA-C ? ? ?Encounter Date: 10/07/2021 ? ? PT End of Session - 10/07/21 1704   ? ? Visit Number 3   ? Number of Visits 8   ? Date for PT Re-Evaluation 11/11/21   ? Authorization Type BCBS   ? PT Start Time 1704   ? PT Stop Time 1745   ? PT Time Calculation (min) 41 min   ? Activity Tolerance Patient tolerated treatment well   ? Behavior [facility] tasks assessed/performed   ? ?  ?  ? ?  ? ? ?Past Medical History:  ?Diagnosis Date  ? Anxiety   ? BPH (benign prostatic hyperplasia) 10/15/2014  ? Hyperlipidemia 10/16/2014  ? AHA 10 year risk of 2.8% March 2016   ? ? ?No past surgical history on file. ? ?There were no vitals filed for this visit. ? ? Subjective Assessment - 10/07/21 1707   ? ? Subjective Pt reports good response with TPDN after 1 day of post needle soreness. He notes that the days after that he had increased neck pain due to increased time at work.   ? Limitations Lifting;Other (comment)   ? How long can you sit comfortably? n/a   ? How long can you stand comfortably? n/a   ? How long can you walk comfortably? n/a   ? Diagnostic tests Cervical x-ray: Mild degenerative disc and degenerative joint disease resulting in  multilevel mild neuroforaminal narrowing as described above.   ? Patient Stated Goals A little bit of relief with those times   ? Currently in Pain? Yes   ? Pain Score 7    ? Pain Location Neck   ? Pain Onset More than a month ago   ? ?  ?  ? ?  ? ? ? ? ? [facility] PT Assessment - 10/07/21 0001   ? ?  ? Assessment  ? Medical Diagnosis M54.2,G89.29 (ICD-10-CM) - Neck pain, chronic  M50.30 (ICD-10-CM) - Degenerative disc disease, cervical   ? Referring Provider (PT) Jomarie LongsBreeback, Jade L,  PA-C   ? Hand Dominance Right   ? Prior Therapy None   ?  ? Precautions  ? Precautions None   ? ?  ?  ? ?  ? ? ? ? ? ? ? ? ? ? ? ? ? ? ? ? [facility] Adult PT Treatment/Exercise - 10/07/21 0001   ? ?  ? Shoulder Exercises: Seated  ? Other Seated Exercises seated neck retraction + rotation x 10; neck retraction + ext x10   ?  ? Shoulder Exercises: Prone  ? Other Prone Exercises Neck retraction 5x5" in prone   ?  ? Shoulder Exercises: Sidelying  ? External Rotation Strengthening;Left;Right;10 reps   ? External Rotation Weight (lbs) 3   ? Flexion Strengthening;10 reps;Right;Left   ? Flexion Weight (lbs) 1   ? Flexion Limitations --   trialed 2 lbs but increased pain noted  ? ABduction Strengthening;Right;Left;10 reps   ? ABduction Weight (lbs) 1   trialed 2 lbs but increased pain noted  ?  ? Shoulder Exercises: Stretch  ? Other Shoulder Stretches UT stretch x30 sec, levator scap stretch x 30 sec   ?  ? Manual Therapy  ? Manual therapy comments  skilled assessment and palpation for TPDN and STM   ? Soft tissue mobilization STM & TPR cervical paraspinals, post scalenes   ? ?  ?  ? ?  ? ? ? Trigger Point Dry Needling - 10/07/21 0001   ? ? Consent Given? Yes   ? Education Handout Provided No   ? Muscles Treated Head and Neck Splenius capitus;Scalenes   ? Oblique Capitus Response Twitch response elicited;Palpable increased muscle length   ? Scalenes Response Twitch reponse elicited;Palpable increased muscle length   posterior scalenes  ? Splenius capitus Response Twitch reponse elicited;Palpable increased muscle length   ? Cervical multifidi Response Palpable increased muscle length;Twitch reponse elicited   ? ?  ?  ? ?  ? ? ? ? ? ? ? ? ? ? PT Short Term Goals - 09/16/21 1757   ? ?  ? PT SHORT TERM GOAL #1  ? Title STGs = LTGs   ? ?  ?  ? ?  ? ? ? ? PT Long Term Goals - 09/16/21 1758   ? ?  ? PT LONG TERM GOAL #1  ? Title Pt will be independent with HEP   ? Time 8   ? Period Weeks   ? Status New   ? Target Date 11/11/21   ?  ? PT  LONG TERM GOAL #2  ? Title Pt will demo improved posterior shoulder girdle strength to at least 4+/5 for improved cervicothoracic stability   ? Time 8   ? Period Weeks   ? Status New   ? Target Date 11/11/21   ?  ? PT LONG TERM GOAL #3  ? Title Pt will be able to lift at least 10# overhead for work activities   ? Baseline Unable   ? Time 8   ? Period Weeks   ? Status New   ? Target Date 11/11/21   ?  ? PT LONG TERM GOAL #4  ? Title Pt will report decrease in pain by >/=25% with no N/T   ? Baseline 4/10 at worst   ? Time 8   ? Period Weeks   ? Status New   ? Target Date 11/11/21   ?  ? PT LONG TERM GOAL #5  ? Title Pt will have improved FOTO to 64   ? Baseline 53   ? Time 8   ? Period Weeks   ? Status New   ? Target Date 11/11/21   ? ?  ?  ? ?  ? ? ? ? ? ? ? ? Plan - 10/07/21 1752   ? ? Clinical Impression Statement Good response to TPDN last session; provided it again this session. Less trigger points along UTs and suboccipitals today -- mostly cervical paraspinals. Able to tolerate shoulder elevation strengthening in sidelying with 1 lb. 2 lbs caused increased pain. Continued to work on deep neck flexor strengthening today. Pt tolerated well. Encouraged pt to continue to work on posterior shoulder girdle strengthening.   ? Personal Factors and Comorbidities Age;Fitness;Time since onset of injury/illness/exacerbation;Profession   ? Examination-Activity Limitations Reach Overhead;Carry;Lift;Sleep   ? Examination-Participation Restrictions Community Activity;Occupation;Pincus Badder Work   ? Stability/Clinical Decision Making Stable/Uncomplicated   ? Rehab Potential Good   ? PT Frequency 1x / week   ? PT Duration 8 weeks   ? PT Treatment/Interventions ADLs/Self Care Home Management;Cryotherapy;Electrical Stimulation;Aquatic Therapy;Iontophoresis 4mg /ml Dexamethasone;Moist Heat;Traction;DME Instruction;Functional mobility training;Therapeutic activities;Therapeutic exercise;Neuromuscular re-education;Patient/family  education;Manual techniques;Passive range of motion;Dry needling;Taping   ? PT Next  Visit Plan Trial traction (pt had asked about it). Manual work or TPDN as needed. Continue posterior shoulder girdle strengthening (especially mid/low trap and RTC). Continue to work on scapular stabilization and deep neck flexor strengthening   ? PT Home Exercise Plan Access Code [email or web address]   ? Consulted and Agree with Plan of Care Patient   ? ?  ?  ? ?  ? ? ?Patient will benefit from skilled therapeutic intervention in order to improve the following deficits and impairments:  Increased fascial restricitons, Decreased range of motion, Increased muscle spasms, Impaired UE functional use, Pain, Hypomobility, Decreased mobility, Decreased strength, Postural dysfunction ? ?Visit Diagnosis: ?Muscle weakness (generalized) ? ?Cervicalgia ? ?Abnormal posture ? ? ? ? ?Problem List ?Patient Active Problem List  ? Diagnosis Date Noted  ? Degenerative disc disease, cervical 08/23/2021  ? SOB (shortness of breath) 04/24/2021  ? Nasal congestion 05/17/2018  ? Tension headache 04/29/2017  ? Cognitive changes 03/19/2017  ? Weakness 03/19/2017  ? No energy 03/19/2017  ? Anxiety 03/19/2017  ? Dysthymia 03/19/2017  ? Neck pain, chronic 03/19/2017  ? Hyperlipidemia 10/16/2014  ? BPH (benign prostatic hyperplasia) 10/15/2014  ? Enlarged prostate without lower urinary tract symptoms (luts) 10/15/2014  ? Tobacco abuse 08/15/2012  ? Tobacco use 08/15/2012  ? Anxiety, generalized 08/18/2011  ? Generalized anxiety disorder 08/18/2011  ? ? ?Ana Liaw April Dell Ponto, PT, DPT ?10/07/2021, 5:57 PM ? ?[place] ?[facility] ?[address] ?[place], [place], [address] ?Phone: [phone]   Fax:  [phone] ? ?Name: NEWTON FRUTIGER ?MRN: [id number] ?Date of Birth: 1965-09-12 ? ? ? ?

## 2021-10-14 ENCOUNTER — Ambulatory Visit: Payer: BC Managed Care – PPO | Admitting: Physical Therapy

## 2021-10-14 DIAGNOSIS — R293 Abnormal posture: Secondary | ICD-10-CM

## 2021-10-14 DIAGNOSIS — G8929 Other chronic pain: Secondary | ICD-10-CM | POA: Diagnosis not present

## 2021-10-14 DIAGNOSIS — M542 Cervicalgia: Secondary | ICD-10-CM | POA: Diagnosis not present

## 2021-10-14 DIAGNOSIS — M6281 Muscle weakness (generalized): Secondary | ICD-10-CM

## 2021-10-14 DIAGNOSIS — M5030 Other cervical disc degeneration, unspecified cervical region: Secondary | ICD-10-CM | POA: Diagnosis not present

## 2021-10-14 NOTE — Therapy (Signed)
[place] ?[facility] ?[address] ?[place], [place], [address] ?Phone: [phone]   Fax:  [phone] ? ?Physical Therapy Treatment ? ?Patient Details  ?Name: Jason [name] ?MRN: [id number] ?Date of Birth: 12-31-1965 ?Referring Provider (PT): Jomarie LongsBreeback, Jade L, PA-C ? ? ?Encounter Date: 10/14/2021 ? ? PT End of Session - 10/14/21 1705   ? ? Visit Number 4   ? Number of Visits 8   ? Date for PT Re-Evaluation 11/11/21   ? Authorization Type BCBS   ? PT Start Time 1705   ? PT Stop Time 1745   ? PT Time Calculation (min) 40 min   ? Activity Tolerance Patient tolerated treatment well   ? Behavior [facility] Therapy [facility] for tasks assessed/performed   ? ?  ?  ? ?  ? ? ?Past Medical History:  ?Diagnosis Date  ? Anxiety   ? BPH (benign prostatic hyperplasia) 10/15/2014  ? Hyperlipidemia 10/16/2014  ? AHA 10 year risk of 2.8% March 2016   ? ? ?No past surgical history on file. ? ?There were no vitals filed for this visit. ? ? Subjective Assessment - 10/14/21 1706   ? ? Subjective Pt states neck is doing better. But shoulder can still be painful. Has noted improved ability to look over his shoulder while driving and less neck stiffness in the mornings.   ? Limitations Lifting;Other (comment)   ? How long can you sit comfortably? n/a   ? How long can you stand comfortably? n/a   ? How long can you walk comfortably? n/a   ? Diagnostic tests Cervical x-ray: Mild degenerative disc and degenerative joint disease resulting in  multilevel mild neuroforaminal narrowing as described above.   ? Patient Stated Goals A little bit of relief with those times   ? Currently in Pain? No/denies   ? Pain Onset More than a month ago   ? ?  ?  ? ?  ? ? ? ? ? [facility] PT Assessment - 10/14/21 0001   ? ?  ? Assessment  ? Medical Diagnosis M54.2,G89.29 (ICD-10-CM) - Neck pain, chronic  M50.30 (ICD-10-CM) - Degenerative disc disease, cervical   ? Referring Provider (PT) Jomarie LongsBreeback, Jade L, PA-C   ? Hand Dominance  Right   ? Prior Therapy None   ?  ? Precautions  ? Precautions None   ? ?  ?  ? ?  ? ? ? ? ? ? ? ? ? ? ? ? ? ? ? ? [facility] Adult PT Treatment/Exercise - 10/14/21 0001   ? ?  ? Shoulder Exercises: Seated  ? External Rotation Strengthening;Both;20 reps;Theraband   ? External Rotation Limitations "W" position   ? Flexion Strengthening;Right;Left;Theraband;20 reps   ? Theraband Level (Shoulder Flexion) Level 2 (Red)   ? Abduction Strengthening;Right;Left;10 reps;Theraband   ? Theraband Level (Shoulder ABduction) Level 2 (Red)   ? Other Seated Exercises seated neck retraction x10   ? Other Seated Exercises Press up 2# 2x10   ?  ? Shoulder Exercises: Sidelying  ? External Rotation Weight (lbs) 2   ? Flexion Strengthening;10 reps;Right;Left   ? Flexion Weight (lbs) 2   ? ABduction Strengthening;Right;Left;10 reps   ? ABduction Weight (lbs) 2   ?  ? Shoulder Exercises: ROM/Strengthening  ? UBE (Upper Arm Bike) L3, 2 min forward, 2 min backward   ?  ? Shoulder Exercises: Stretch  ? Other Shoulder Stretches Cervical SNAGs for rotation with towel 3x5 sec contract relax   ?  ?  Manual Therapy  ? Manual Therapy Joint mobilization   ? Manual therapy comments Contract relax cervical rotation 3x5 sec hold   ? Joint Mobilization Grade 3 mobs as follows on C3-C4: L & R lateral glides, L rotation, unilateral PA mobs on L. Cervical distraction x 20 sec.   ? ?  ?  ? ?  ? ? ? ? ? ? ? ? ? ? ? ? PT Short Term Goals - 09/16/21 1757   ? ?  ? PT SHORT TERM GOAL #1  ? Title STGs = LTGs   ? ?  ?  ? ?  ? ? ? ? PT Long Term Goals - 09/16/21 1758   ? ?  ? PT LONG TERM GOAL #1  ? Title Pt will be independent with HEP   ? Time 8   ? Period Weeks   ? Status New   ? Target Date 11/11/21   ?  ? PT LONG TERM GOAL #2  ? Title Pt will demo improved posterior shoulder girdle strength to at least 4+/5 for improved cervicothoracic stability   ? Time 8   ? Period Weeks   ? Status New   ? Target Date 11/11/21   ?  ? PT LONG TERM GOAL #3  ? Title Pt will be able to  lift at least 10# overhead for work activities   ? Baseline Unable   ? Time 8   ? Period Weeks   ? Status New   ? Target Date 11/11/21   ?  ? PT LONG TERM GOAL #4  ? Title Pt will report decrease in pain by >/=25% with no N/T   ? Baseline 4/10 at worst   ? Time 8   ? Period Weeks   ? Status New   ? Target Date 11/11/21   ?  ? PT LONG TERM GOAL #5  ? Title Pt will have improved FOTO to 64   ? Baseline 53   ? Time 8   ? Period Weeks   ? Status New   ? Target Date 11/11/21   ? ?  ?  ? ?  ? ? ? ? ? ? ? ? Plan - 10/14/21 1744   ? ? Clinical Impression Statement Pt comes in with very little neck pain this session. Reports some pain at end range rotation. Performed manual work to address any residual joint tightness especially along C3-C4. Demonstrated how pt can perform SNAGs at home for neck rotation. Pt now able to tolerate shoulder movement against gravity with no pain. Progressed pt to strengthening exercises in seated with good tolerance. Mild crepitus in L shoulder but no pain.   ? Personal Factors and Comorbidities Age;Fitness;Time since onset of injury/illness/exacerbation;Profession   ? Examination-Activity Limitations Reach Overhead;Carry;Lift;Sleep   ? Examination-Participation Restrictions Community Activity;Occupation;Pincus Badder Work   ? Stability/Clinical Decision Making Stable/Uncomplicated   ? Rehab Potential Good   ? PT Frequency 1x / week   ? PT Duration 8 weeks   ? PT Treatment/Interventions ADLs/Self Care Home Management;Cryotherapy;Electrical Stimulation;Aquatic Therapy;Iontophoresis 4mg /ml Dexamethasone;Moist Heat;Traction;DME Instruction;Functional mobility training;Therapeutic activities;Therapeutic exercise;Neuromuscular re-education;Patient/family education;Manual techniques;Passive range of motion;Dry needling;Taping   ? PT Next Visit Plan Manual work or TPDN as needed. Continue posterior shoulder girdle strengthening (especially mid/low trap and RTC). Continue to work on scapular stabilization and  deep neck flexor strengthening   ? PT Home Exercise Plan Access Code [email or web address]   ? Consulted and Agree with Plan of Care Patient   ? ?  ?  ? ?  ? ? ?  Patient will benefit from skilled therapeutic intervention in order to improve the following deficits and impairments:  Increased fascial restricitons, Decreased range of motion, Increased muscle spasms, Impaired UE functional use, Pain, Hypomobility, Decreased mobility, Decreased strength, Postural dysfunction ? ?Visit Diagnosis: ?Muscle weakness (generalized) ? ?Cervicalgia ? ?Abnormal posture ? ? ? ? ?Problem List ?Patient Active Problem List  ? Diagnosis Date Noted  ? Degenerative disc disease, cervical 08/23/2021  ? SOB (shortness of breath) 04/24/2021  ? Nasal congestion 05/17/2018  ? Tension headache 04/29/2017  ? Cognitive changes 03/19/2017  ? Weakness 03/19/2017  ? No energy 03/19/2017  ? Anxiety 03/19/2017  ? Dysthymia 03/19/2017  ? Neck pain, chronic 03/19/2017  ? Hyperlipidemia 10/16/2014  ? BPH (benign prostatic hyperplasia) 10/15/2014  ? Enlarged prostate without lower urinary tract symptoms (luts) 10/15/2014  ? Tobacco abuse 08/15/2012  ? Tobacco use 08/15/2012  ? Anxiety, generalized 08/18/2011  ? Generalized anxiety disorder 08/18/2011  ? ? ?Cailey Trigueros April Dell Ponto, PT, DPT ?10/14/2021, 5:53 PM ? ?[place] ?[facility] ?[address] ?[place], [place], [address] ?Phone: [phone]   Fax:  [phone] ? ?Name: Jason [name] ?MRN: [id number] ?Date of Birth: 1966-02-22 ? ? ? ?

## 2021-10-21 ENCOUNTER — Ambulatory Visit: Payer: BC Managed Care – PPO | Attending: Physician Assistant | Admitting: Physical Therapy

## 2021-10-21 DIAGNOSIS — R293 Abnormal posture: Secondary | ICD-10-CM | POA: Diagnosis not present

## 2021-10-21 DIAGNOSIS — M542 Cervicalgia: Secondary | ICD-10-CM | POA: Insufficient documentation

## 2021-10-21 DIAGNOSIS — M6281 Muscle weakness (generalized): Secondary | ICD-10-CM | POA: Insufficient documentation

## 2021-10-21 NOTE — Therapy (Signed)
[place] ?[facility] ?[place], [place], [address] ?Phone: [phone]   Fax:  [phone] ? ?Physical Therapy Treatment and Discharge ? ?Patient Details  ?Name: Jason [name] ?MRN: [id number] ?Date of Birth: 06-02-1966 ?Referring Provider (PT): Donella Stade, PA-C ? ?PHYSICAL THERAPY DISCHARGE SUMMARY ? ?Visits from Start of Care: 5 ? ?Current functional level related to goals / functional outcomes: ?See below ?  ?Remaining deficits: ?See below ?  ?Education / Equipment: ?See below  ? ?Patient agrees to discharge. Patient goals were met. Patient is being discharged due to meeting the stated rehab goals. ? ? ?Encounter Date: 10/21/2021 ? ? PT End of Session - 10/21/21 1702   ? ? Visit Number 5   ? Number of Visits 8   ? Date for PT Re-Evaluation 11/11/21   ? Authorization Type BCBS   ? PT Start Time 1702   ? PT Stop Time 1735   ? PT Time Calculation (min) 33 min   ? Activity Tolerance Patient tolerated treatment well   ? Behavior During Therapy [facility] for tasks assessed/performed   ? ?  ?  ? ?  ? ? ?Past Medical History:  ?Diagnosis Date  ? Anxiety   ? BPH (benign prostatic hyperplasia) 10/15/2014  ? Hyperlipidemia 10/16/2014  ? AHA 10 year risk of 2.8% March 2016   ? ? ?No past surgical history on file. ? ?There were no vitals filed for this visit. ? ? Subjective Assessment - 10/21/21 1703   ? ? Subjective Pt reports he is doing well. Neck is a lot better and is now able to turn and look over his shoulder while driving without turning his whole body. Still some shoulder pain and popping but he feels he can work on this independently at home. Notes some soreness in his neck with heavy work activities but much more manageable.   ? Limitations Lifting;Other (comment)   ? How long can you sit comfortably? n/a   ? How long can you stand comfortably? n/a   ? How long can you walk comfortably? n/a   ? Diagnostic tests Cervical x-ray: Mild degenerative disc and  degenerative joint disease resulting in  multilevel mild neuroforaminal narrowing as described above.   ? Patient Stated Goals A little bit of relief with those times   ? Currently in Pain? Yes   ? Pain Score 2    ? Pain Location Neck   ? Pain Onset More than a month ago   ? ?  ?  ? ?  ? ? ? ? ? [facility] PT Assessment - 10/21/21 0001   ? ?  ? Assessment  ? Medical Diagnosis M54.2,G89.29 (ICD-10-CM) - Neck pain, chronic  M50.30 (ICD-10-CM) - Degenerative disc disease, cervical   ? Referring Provider (PT) Donella Stade, PA-C   ? Hand Dominance Right   ? Prior Therapy None   ?  ? AROM  ? Cervical Flexion 65   ? Cervical Extension 65   ? Cervical - Right Side Bend 55   ? Cervical - Left Side Bend 55   ? Cervical - Right Rotation 63   ? Cervical - Left Rotation 65   ?  ? Strength  ? Right Shoulder Flexion 4+/5   ? Right Shoulder ABduction 5/5   ? Right Shoulder Internal Rotation 5/5   ? Right Shoulder External Rotation 5/5   ? Right Shoulder Horizontal ABduction 4+/5   ? Left Shoulder Flexion 4+/5   ?  Left Shoulder ABduction 5/5   ? Left Shoulder Internal Rotation 5/5   ? Left Shoulder External Rotation 5/5   ? Left Shoulder Horizontal ABduction --   4+/5  ? ?  ?  ? ?  ? ? ? ? ? ? ? ? ? ? ? ? ? ? ? ? [facility] Adult PT Treatment/Exercise - 10/21/21 0001   ? ?  ? Shoulder Exercises: Standing  ? Other Standing Exercises low trap setting green tband 2x10 against wall   ? Other Standing Exercises serratus anterior wall slide 2x10; wall plank with scapular clock green tband 2x10 each side   ? ?  ?  ? ?  ? ? ? ? ? ? ? ? ? ? ? ? PT Short Term Goals - 09/16/21 1757   ? ?  ? PT SHORT TERM GOAL #1  ? Title STGs = LTGs   ? ?  ?  ? ?  ? ? ? ? PT Long Term Goals - 10/21/21 1734   ? ?  ? PT LONG TERM GOAL #1  ? Title Pt will be independent with HEP   ? Time 8   ? Period Weeks   ? Status Achieved   ? Target Date 11/11/21   ?  ? PT LONG TERM GOAL #2  ? Title Pt will demo improved posterior shoulder girdle strength to at least 4+/5 for  improved cervicothoracic stability   ? Time 8   ? Period Weeks   ? Status Achieved   ? Target Date 11/11/21   ?  ? PT LONG TERM GOAL #3  ? Title Pt will be able to lift at least 10# overhead for work activities   ? Baseline Unable   ? Time 8   ? Period Weeks   ? Status Achieved   ? Target Date 11/11/21   ?  ? PT LONG TERM GOAL #4  ? Title Pt will report decrease in pain by >/=25% with no N/T   ? Baseline 4/10 at worst   ? Time 8   ? Period Weeks   ? Status Achieved   ? Target Date 11/11/21   ?  ? PT LONG TERM GOAL #5  ? Title Pt will have improved FOTO to 64   ? Time 8   ? Period Weeks   ? Status Not Met   ? Target Date 11/11/21   ? ?  ?  ? ?  ? ? ? ? ? ? ? ? Plan - 10/21/21 1734   ? ? Clinical Impression Statement Pt continues to have manageable neck pain. He feels ready for PT d/c and thinks he can manage to continue his strengthening at home. His ROM and strength have all improved. FOTO score decreased; however, this may be due to pt misinterpreting instructions to how he feels now. For example, pt subjectively reported to PT that he now has no difficulty turning to look behind him in the car but on FOTO he reports "quite a bit of difficulty" on this item. Pt has met PT goals.   ? Personal Factors and Comorbidities Age;Fitness;Time since onset of injury/illness/exacerbation;Profession   ? Examination-Activity Limitations Reach Overhead;Carry;Lift;Sleep   ? Examination-Participation Restrictions Community Activity;Occupation;[employer] Work   ? Stability/Clinical Decision Making Stable/Uncomplicated   ? Rehab Potential Good   ? PT Frequency 1x / week   ? PT Duration 8 weeks   ? PT Treatment/Interventions ADLs/Self Care Home Management;Cryotherapy;Electrical Stimulation;Aquatic Therapy;Iontophoresis 5m/ml Dexamethasone;Moist Heat;Traction;DME Instruction;Functional mobility training;Therapeutic activities;Therapeutic  exercise;Neuromuscular re-education;Patient/family education;Manual techniques;Passive range of  motion;Dry needling;Taping   ? PT Next Visit Plan Manual work or TPDN as needed. Continue posterior shoulder girdle strengthening (especially mid/low trap and RTC). Continue to work on scapular stabilization and deep neck flexor strengthening   ? PT Home Exercise Plan Access Code [email or web address]   ? Consulted and Agree with Plan of Care Patient   ? ?  ?  ? ?  ? ? ?Patient will benefit from skilled therapeutic intervention in order to improve the following deficits and impairments:  Increased fascial restricitons, Decreased range of motion, Increased muscle spasms, Impaired UE functional use, Pain, Hypomobility, Decreased mobility, Decreased strength, Postural dysfunction ? ?Visit Diagnosis: ?Muscle weakness (generalized) ? ?Cervicalgia ? ?Abnormal posture ? ? ? ? ?Problem List ?Patient Active Problem List  ? Diagnosis Date Noted  ? Degenerative disc disease, cervical 08/23/2021  ? SOB (shortness of breath) 04/24/2021  ? Nasal congestion 05/17/2018  ? Tension headache 04/29/2017  ? Cognitive changes 03/19/2017  ? Weakness 03/19/2017  ? No energy 03/19/2017  ? Anxiety 03/19/2017  ? Dysthymia 03/19/2017  ? Neck pain, chronic 03/19/2017  ? Hyperlipidemia 10/16/2014  ? BPH (benign prostatic hyperplasia) 10/15/2014  ? Enlarged prostate without lower urinary tract symptoms (luts) 10/15/2014  ? Tobacco abuse 08/15/2012  ? Tobacco use 08/15/2012  ? Anxiety, generalized 08/18/2011  ? Generalized anxiety disorder 08/18/2011  ? ? ?Joell Buerger April Gordy Levan, PT, DPT ?10/21/2021, 5:45 PM ? ?[place] ?[facility] ?[address] ?[place], [place], [address] ?Phone: [phone]   Fax:  [phone] ? ?Name: Jason [name] ?MRN: [id number] ?Date of Birth: May 13, 1966 ? ? ? ?

## 2022-01-14 DIAGNOSIS — M545 Low back pain, unspecified: Secondary | ICD-10-CM | POA: Diagnosis not present

## 2022-01-14 DIAGNOSIS — M62838 Other muscle spasm: Secondary | ICD-10-CM | POA: Diagnosis not present

## 2022-03-06 ENCOUNTER — Other Ambulatory Visit: Payer: Self-pay | Admitting: Physician Assistant

## 2022-03-06 DIAGNOSIS — G8929 Other chronic pain: Secondary | ICD-10-CM

## 2022-03-06 DIAGNOSIS — M5030 Other cervical disc degeneration, unspecified cervical region: Secondary | ICD-10-CM

## 2022-03-16 DIAGNOSIS — K219 Gastro-esophageal reflux disease without esophagitis: Secondary | ICD-10-CM | POA: Diagnosis not present

## 2022-03-16 DIAGNOSIS — D126 Benign neoplasm of colon, unspecified: Secondary | ICD-10-CM | POA: Diagnosis not present

## 2022-03-16 DIAGNOSIS — R1312 Dysphagia, oropharyngeal phase: Secondary | ICD-10-CM | POA: Diagnosis not present

## 2022-03-29 DIAGNOSIS — L821 Other seborrheic keratosis: Secondary | ICD-10-CM | POA: Diagnosis not present

## 2022-03-29 DIAGNOSIS — H61001 Unspecified perichondritis of right external ear: Secondary | ICD-10-CM | POA: Diagnosis not present

## 2022-03-29 DIAGNOSIS — D485 Neoplasm of uncertain behavior of skin: Secondary | ICD-10-CM | POA: Diagnosis not present

## 2022-03-31 DIAGNOSIS — R1312 Dysphagia, oropharyngeal phase: Secondary | ICD-10-CM | POA: Diagnosis not present

## 2022-03-31 DIAGNOSIS — R059 Cough, unspecified: Secondary | ICD-10-CM | POA: Diagnosis not present

## 2022-05-23 DIAGNOSIS — U071 COVID-19: Secondary | ICD-10-CM | POA: Diagnosis not present

## 2022-06-12 ENCOUNTER — Other Ambulatory Visit: Payer: Self-pay | Admitting: Physician Assistant

## 2022-06-12 DIAGNOSIS — G8929 Other chronic pain: Secondary | ICD-10-CM

## 2022-06-12 DIAGNOSIS — M5030 Other cervical disc degeneration, unspecified cervical region: Secondary | ICD-10-CM

## 2022-07-09 ENCOUNTER — Other Ambulatory Visit: Payer: Self-pay | Admitting: Physician Assistant

## 2022-07-09 DIAGNOSIS — G8929 Other chronic pain: Secondary | ICD-10-CM

## 2022-07-09 DIAGNOSIS — M5030 Other cervical disc degeneration, unspecified cervical region: Secondary | ICD-10-CM

## 2022-08-06 ENCOUNTER — Other Ambulatory Visit: Payer: Self-pay | Admitting: Physician Assistant

## 2022-08-06 DIAGNOSIS — G8929 Other chronic pain: Secondary | ICD-10-CM

## 2022-08-06 DIAGNOSIS — M5030 Other cervical disc degeneration, unspecified cervical region: Secondary | ICD-10-CM

## 2022-08-09 ENCOUNTER — Encounter: Payer: Self-pay | Admitting: Physician Assistant

## 2022-08-17 ENCOUNTER — Ambulatory Visit (INDEPENDENT_AMBULATORY_CARE_PROVIDER_SITE_OTHER): Payer: BC Managed Care – PPO | Admitting: Physician Assistant

## 2022-08-17 ENCOUNTER — Encounter: Payer: Self-pay | Admitting: Physician Assistant

## 2022-08-17 VITALS — BP 106/66 | HR 77 | Ht 73.0 in | Wt 161.0 lb

## 2022-08-17 DIAGNOSIS — M542 Cervicalgia: Secondary | ICD-10-CM

## 2022-08-17 DIAGNOSIS — Z131 Encounter for screening for diabetes mellitus: Secondary | ICD-10-CM | POA: Diagnosis not present

## 2022-08-17 DIAGNOSIS — E559 Vitamin D deficiency, unspecified: Secondary | ICD-10-CM | POA: Diagnosis not present

## 2022-08-17 DIAGNOSIS — N401 Enlarged prostate with lower urinary tract symptoms: Secondary | ICD-10-CM

## 2022-08-17 DIAGNOSIS — G8929 Other chronic pain: Secondary | ICD-10-CM

## 2022-08-17 DIAGNOSIS — Z716 Tobacco abuse counseling: Secondary | ICD-10-CM

## 2022-08-17 DIAGNOSIS — Z122 Encounter for screening for malignant neoplasm of respiratory organs: Secondary | ICD-10-CM

## 2022-08-17 DIAGNOSIS — R5383 Other fatigue: Secondary | ICD-10-CM | POA: Diagnosis not present

## 2022-08-17 DIAGNOSIS — Z125 Encounter for screening for malignant neoplasm of prostate: Secondary | ICD-10-CM | POA: Diagnosis not present

## 2022-08-17 DIAGNOSIS — E538 Deficiency of other specified B group vitamins: Secondary | ICD-10-CM | POA: Diagnosis not present

## 2022-08-17 DIAGNOSIS — Z1159 Encounter for screening for other viral diseases: Secondary | ICD-10-CM

## 2022-08-17 DIAGNOSIS — Z0001 Encounter for general adult medical examination with abnormal findings: Secondary | ICD-10-CM

## 2022-08-17 DIAGNOSIS — F17200 Nicotine dependence, unspecified, uncomplicated: Secondary | ICD-10-CM

## 2022-08-17 DIAGNOSIS — Z23 Encounter for immunization: Secondary | ICD-10-CM | POA: Diagnosis not present

## 2022-08-17 DIAGNOSIS — E782 Mixed hyperlipidemia: Secondary | ICD-10-CM

## 2022-08-17 DIAGNOSIS — M5030 Other cervical disc degeneration, unspecified cervical region: Secondary | ICD-10-CM

## 2022-08-17 DIAGNOSIS — Z1329 Encounter for screening for other suspected endocrine disorder: Secondary | ICD-10-CM

## 2022-08-17 DIAGNOSIS — R351 Nocturia: Secondary | ICD-10-CM

## 2022-08-17 DIAGNOSIS — Z0000 Encounter for general adult medical examination without abnormal findings: Secondary | ICD-10-CM

## 2022-08-17 MED ORDER — BACLOFEN 10 MG PO TABS: 10.0000 mg | ORAL_TABLET | Freq: Every evening | ORAL | 0 refills | Status: DC | PRN

## 2022-08-17 MED ORDER — MELOXICAM 15 MG PO TABS: ORAL_TABLET | ORAL | 3 refills | Status: DC

## 2022-08-17 MED ORDER — VENLAFAXINE HCL ER 75 MG PO CP24: 75.0000 mg | ORAL_CAPSULE | Freq: Every day | ORAL | 3 refills | Status: DC

## 2022-08-17 MED ORDER — BUPROPION HCL ER (SR) 100 MG PO TB12: 100.0000 mg | ORAL_TABLET | Freq: Two times a day (BID) | ORAL | 2 refills | Status: DC

## 2022-08-17 NOTE — Progress Notes (Signed)
Complete physical exam  Patient: Jason [name]   DOB: [birth year]   57 y.o. Male  MRN: [id number]  Subjective:    Chief Complaint  Patient presents with   Annual Exam    Jason [name] is a 57 y.o. male who presents today for a complete physical exam. He reports consuming a general diet. The patient does not participate in regular exercise at present. He generally feels fairly well. He reports sleeping fairly well. He does have additional problems to discuss today.   Patient does want to consider stopping smoking.  He has smoked about a pack and a half a day since he was in his 35s.  He has noticed that his shortness of breath has gotten worse.  He does notice that he has a chronic cough.  He denies any significant wheezing, chest tightness, shortness of breath that keeps him from doing things.  He continues to have cervical neck pain.  He denies any strength changes in his upper extremity.  He does have a physically demanding job where he is doing a lot of lifting.  He notices more pain on the days where he does more lifting.  Meloxicam does help some. PT helped last year but is not doing exercises.     Most recent fall risk assessment:    08/17/2022    8:49 AM  Fall Risk   Falls in the past year? 0  Number falls in past yr: 0  Injury with Fall? 0  Risk for fall due to : No Fall Risks  Follow up Falls evaluation completed     Most recent depression screenings:    08/17/2022    8:49 AM 04/21/2021    1:16 PM  PHQ 2/9 Scores  PHQ - 2 Score 3 2  PHQ- 9 Score 11 5    Vision:Within last year, Dental: No current dental problems and Receives regular dental care, and PSA: Agrees to PSA testing  Patient Active Problem List   Diagnosis Date Noted   Degenerative disc disease, cervical 08/23/2021   SOB (shortness of breath) 04/24/2021   Nasal congestion 05/17/2018   Tension headache 04/29/2017   Cognitive changes 03/19/2017   Weakness 03/19/2017   No energy 03/19/2017    Anxiety 03/19/2017   Dysthymia 03/19/2017   Neck pain, chronic 03/19/2017   Hyperlipidemia 10/16/2014   BPH (benign prostatic hyperplasia) 10/15/2014   Enlarged prostate without lower urinary tract symptoms (luts) 10/15/2014   Tobacco abuse 08/15/2012   Tobacco use 08/15/2012   Anxiety, generalized 08/18/2011   Generalized anxiety disorder 08/18/2011   Past Medical History:  Diagnosis Date   Anxiety    BPH (benign prostatic hyperplasia) 10/15/2014   Hyperlipidemia 10/16/2014   AHA 10 year risk of 2.8% March 2016    History reviewed. No pertinent surgical history. Family History  Problem Relation Age of Onset   Bipolar disorder Father    Heart attack Father 11       pacemaker, smoker   Alcohol abuse Paternal Uncle    Hypertension Unknown    Alcohol abuse Unknown    Allergies  Allergen Reactions   Octacosanol     Sinus and head pressure      Patient Care Team: Lavada Mesi as PCP - General (Family Medicine) Marcial Pacas, DO as Consulting Physician (Family Medicine)   Outpatient Medications Prior to Visit  Medication Sig   albuterol (VENTOLIN HFA) 108 (90 Base) MCG/ACT inhaler Inhale 2 puffs into the lungs every 6 (  six) hours as needed.   famotidine (PEPCID) 40 MG tablet Take 40 mg by mouth at bedtime as needed.   MELATONIN PO Take by mouth.   pantoprazole (PROTONIX) 40 MG tablet Take 40 mg by mouth daily.   [DISCONTINUED] buPROPion ER (WELLBUTRIN SR) 100 MG 12 hr tablet Take 1 tablet (100 mg total) by mouth 2 (two) times daily.   [DISCONTINUED] meloxicam (MOBIC) 15 MG tablet TAKE 1 TABLET BY MOUTH ONCE DAILY *  NEED  APPOINTMENT*   [DISCONTINUED] venlafaxine XR (EFFEXOR-XR) 37.5 MG 24 hr capsule Take 1 capsule (37.5 mg total) by mouth daily.   No facility-administered medications prior to visit.    ROS   See HPI.      Objective:     BP 106/66   Pulse 77   Ht 6\' 1"  (1.854 m)   Wt 161 lb (73 kg)   SpO2 100%   BMI 21.24 kg/m  BP Readings from  Last 3 Encounters:  08/17/22 106/66  04/21/21 115/81  02/07/19 115/80   Wt Readings from Last 3 Encounters:  08/17/22 161 lb (73 kg)  08/25/21 162 lb (73.5 kg)  04/21/21 162 lb (73.5 kg)    .Marland Kitchen  [place] Name 08/17/22 0800         During the last Month   Sensation of Bladder not Empty Not at all     Urinate<2 hours after last Less than 1 time in 5     Mult. stop/start when voiding Less than half the time     Difficult to postpone voiding About half the time     Weak urinary stream Less than half the time     Push/strain to begin urination Less than 1 time in 5     Times per night up to urinate About half the time       OTHER   Total Score 12               Physical Exam  BP 106/66   Pulse 77   Ht 6\' 1"  (1.854 m)   Wt 161 lb (73 kg)   SpO2 100%   BMI 21.24 kg/m   General Appearance:    Alert, cooperative, no distress, appears stated age  Head:    Normocephalic, without obvious abnormality, atraumatic  Eyes:    PERRL, conjunctiva/corneas clear, EOM's intact, fundi    benign, both eyes       Ears:    Normal TM's and external ear canals, both ears  Nose:   Nares normal, septum midline, mucosa normal, no drainage    or sinus tenderness  Throat:   Lips, mucosa, and tongue normal; teeth and gums normal  Neck:   Supple, symmetrical, trachea midline, no adenopathy;       thyroid:  No enlargement/tenderness/nodules; no carotid   bruit or JVD  Back:     Symmetric, no curvature, ROM normal, no CVA tenderness  Lungs:     Clear to auscultation bilaterally, respirations unlabored  Chest wall:    No tenderness or deformity  Heart:    Regular rate and rhythm, S1 and S2 normal, no murmur, rub   or gallop  Abdomen:     Soft, non-tender, bowel sounds active all four quadrants,    no masses, no organomegaly        Extremities:   Extremities normal, atraumatic, no cyanosis or edema  Pulses:   2+ and symmetric all extremities  Skin:  Skin color, texture, turgor  normal, no rashes or lesions  Lymph nodes:   Cervical, supraclavicular, and axillary nodes normal  Neurologic:   CNII-XII intact. Normal strength, sensation and reflexes      throughout      Assessment & Plan:    Routine Health Maintenance and Physical Exam  Immunization History  Administered Date(s) Administered   Moderna SARS-COV2 Booster Vaccination 07/07/2020   Moderna Sars-Covid-2 Vaccination 10/10/2019, 11/07/2019   Tdap 11/08/2011, 08/17/2022    Health Maintenance  Topic Date Due   Hepatitis C Screening  Never done   Lung Cancer Screening  Never done   COVID-19 Vaccine (4 - 2023-24 season) 09/02/2022 (Originally 03/18/2022)   INFLUENZA VACCINE  10/16/2022 (Originally 02/15/2022)   Zoster Vaccines- Shingrix (1 of 2) 11/15/2022 (Originally 06/08/2015)   COLONOSCOPY (Pts 45-16yrs Insurance coverage will need to be confirmed)  07/19/2023   DTaP/Tdap/Td (3 - Td or Tdap) 08/17/2032   HIV Screening  Completed   HPV VACCINES  Aged Out    Discussed health benefits of physical activity, and encouraged him to engage in regular exercise appropriate for his age and condition. Thayer Jew was seen today for annual exam.  Diagnoses and all orders for this visit:  Routine physical examination -     PSA -     TSH -     Lipid Panel w/reflex Direct LDL -     COMPLETE METABOLIC PANEL WITH GFR -     CBC with Differential/Platelet  Prostate cancer screening -     PSA  Screening for diabetes mellitus -     COMPLETE METABOLIC PANEL WITH GFR  Mixed hyperlipidemia -     Lipid Panel w/reflex Direct LDL  Thyroid disorder screen -     TSH  Need for Tdap vaccination -     Tdap vaccine greater than or equal to 7yo IM  Degenerative disc disease, cervical -     baclofen (LIORESAL) 10 MG tablet; Take 1 tablet (10 mg total) by mouth at bedtime as needed for muscle spasms. -     venlafaxine XR (EFFEXOR XR) 75 MG 24 hr capsule; Take 1 capsule (75 mg total) by mouth daily with breakfast. -      meloxicam (MOBIC) 15 MG tablet; TAKE 1 TABLET BY MOUTH ONCE DAILY  No energy -     B12 and Folate Panel -     VITAMIN D 25 Hydroxy (Vit-D Deficiency, Fractures) -     Testosterone Total,Free,Bio, Males  Encounter for hepatitis C screening test for low risk patient -     Hepatitis C Antibody  Tobacco dependence -     Ambulatory Referral Lung Cancer Screening [facility]  Encounter for screening for lung cancer -     Ambulatory Referral [facility]  Encounter for smoking cessation counseling -     buPROPion ER (WELLBUTRIN SR) 100 MG 12 hr tablet; Take 1 tablet (100 mg total) by mouth 2 (two) times daily.  Neck pain, chronic -     venlafaxine XR (EFFEXOR XR) 75 MG 24 hr capsule; Take 1 capsule (75 mg total) by mouth daily with breakfast. -     meloxicam (MOBIC) 15 MG tablet; TAKE 1 TABLET BY MOUTH ONCE DAILY   .Marland KitchenStart a regular exercise program and make sure you are eating a healthy diet Try to eat 4 servings of dairy a day or take a calcium supplement (500mg  twice a day). Fasting labs ordered today and added labs for fatigue  PHQ no concerns Vitals look great Pt has long hx of smoking, referral made for lung cancer screening Pt agreed to start wellburin today Discussed side effects and encouraged to stop smoking over the next month but to stay on wellbutrin AUA elevated and symptomatic pt declines medication to help, PSA ordered Consider OTC saw palmetto for symptomatic relief Colonoscopy UTD Tdap given today Declined covid and flu vaccine  Discussed cervical neck pain and known DDD Encouraged pt to consider referral to Dr. Karie Schwalbe and consider MRI, pt declined today Will increase effexor and see if that helps with some of the chronic pain Mobic was refilled Discussed massage therapy, icy hot, tens unit Make sure has a cervical pillow Trial of baclofen at bedtime to help with muscle relaxation PT has helped in the past and offered another  referral, pt declined  Follow up in 3 months for smoking cessation     Tandy Gaw, PA-C

## 2022-08-17 NOTE — Patient Instructions (Signed)
Health Maintenance, Male Adopting a healthy lifestyle and getting preventive care are important in promoting health and wellness. Ask your health care provider about: The right schedule for you to have regular tests and exams. Things you can do on your own to prevent diseases and keep yourself healthy. What should I know about diet, weight, and exercise? Eat a healthy diet  Eat a diet that includes plenty of vegetables, fruits, low-fat dairy products, and lean protein. Do not eat a lot of foods that are high in solid fats, added sugars, or sodium. Maintain a healthy weight Body mass index (BMI) is a measurement that can be used to identify possible weight problems. It estimates body fat based on height and weight. Your health care provider can help determine your BMI and help you achieve or maintain a healthy weight. Get regular exercise Get regular exercise. This is one of the most important things you can do for your health. Most adults should: Exercise for at least 150 minutes each week. The exercise should increase your heart rate and make you sweat (moderate-intensity exercise). Do strengthening exercises at least twice a week. This is in addition to the moderate-intensity exercise. Spend less time sitting. Even light physical activity can be beneficial. Watch cholesterol and blood lipids Have your blood tested for lipids and cholesterol at 58 years of age, then have this test every 5 years. You may need to have your cholesterol levels checked more often if: Your lipid or cholesterol levels are high. You are older than 58 years of age. You are at high risk for heart disease. What should I know about cancer screening? Many types of cancers can be detected early and may often be prevented. Depending on your health history and family history, you may need to have cancer screening at various ages. This may include screening for: Colorectal cancer. Prostate cancer. Skin cancer. Lung  cancer. What should I know about heart disease, diabetes, and high blood pressure? Blood pressure and heart disease High blood pressure causes heart disease and increases the risk of stroke. This is more likely to develop in people who have high blood pressure readings or are overweight. Talk with your health care provider about your target blood pressure readings. Have your blood pressure checked: Every 3-5 years if you are 18-39 years of age. Every year if you are 40 years old or older. If you are between the ages of 65 and 75 and are a current or former smoker, ask your health care provider if you should have a one-time screening for abdominal aortic aneurysm (AAA). Diabetes Have regular diabetes screenings. This checks your fasting blood sugar level. Have the screening done: Once every three years after age 45 if you are at a normal weight and have a low risk for diabetes. More often and at a younger age if you are overweight or have a high risk for diabetes. What should I know about preventing infection? Hepatitis B If you have a higher risk for hepatitis B, you should be screened for this virus. Talk with your health care provider to find out if you are at risk for hepatitis B infection. Hepatitis C Blood testing is recommended for: Everyone born from 1945 through 1965. Anyone with known risk factors for hepatitis C. Sexually transmitted infections (STIs) You should be screened each year for STIs, including gonorrhea and chlamydia, if: You are sexually active and are younger than 58 years of age. You are older than 58 years of age and your   health care provider tells you that you are at risk for this type of infection. Your sexual activity has changed since you were last screened, and you are at increased risk for chlamydia or gonorrhea. Ask your health care provider if you are at risk. Ask your health care provider about whether you are at high risk for HIV. Your health care provider  may recommend a prescription medicine to help prevent HIV infection. If you choose to take medicine to prevent HIV, you should first get tested for HIV. You should then be tested every 3 months for as long as you are taking the medicine. Follow these instructions at home: Alcohol use Do not drink alcohol if your health care provider tells you not to drink. If you drink alcohol: Limit how much you have to 0-2 drinks a day. Know how much alcohol is in your drink. In the [place] one drink equals one 12 oz bottle of beer (355 mL), one 5 oz glass of wine (148 mL), or one 1 oz glass of hard liquor (44 mL). Lifestyle Do not use any products that contain nicotine or tobacco. These products include cigarettes, chewing tobacco, and vaping devices, such as e-cigarettes. If you need help quitting, ask your health care provider. Do not use street drugs. Do not share needles. Ask your health care provider for help if you need support or information about quitting drugs. General instructions Schedule regular health, dental, and eye exams. Stay current with your vaccines. Tell your health care provider if: You often feel depressed. You have ever been abused or do not feel safe at home. Summary Adopting a healthy lifestyle and getting preventive care are important in promoting health and wellness. Follow your health care provider's instructions about healthy diet, exercising, and getting tested or screened for diseases. Follow your health care provider's instructions on monitoring your cholesterol and blood pressure. This information is not intended to replace advice given to you by your health care provider. Make sure you discuss any questions you have with your health care provider. Document Revised: 11/23/2020 Document Reviewed: 11/23/2020 Elsevier Patient Education  2023 Elsevier Inc.  

## 2022-08-18 LAB — TESTOSTERONE TOTAL,FREE,BIO, MALES
Albumin: 4.2 g/dL (ref 3.6–5.1)
Sex Hormone Binding: 54 nmol/L (ref 22–77)
Testosterone, Bioavailable: 159 ng/dL (ref 110.0–575.0)
Testosterone, Free: 82.5 pg/mL (ref 46.0–224.0)
Testosterone: 852 ng/dL — ABNORMAL HIGH (ref 250–827)

## 2022-08-18 LAB — CBC WITH DIFFERENTIAL/PLATELET
Absolute Monocytes: 281 cells/uL (ref 200–950)
Basophils Absolute: 41 cells/uL (ref 0–200)
Basophils Relative: 0.8 %
Eosinophils Absolute: 82 cells/uL (ref 15–500)
Eosinophils Relative: 1.6 %
HCT: 48.8 % (ref 38.5–50.0)
Hemoglobin: 16.5 g/dL (ref 13.2–17.1)
Lymphs Abs: 1622 cells/uL (ref 850–3900)
MCH: 30.1 pg (ref 27.0–33.0)
MCHC: 33.8 g/dL (ref 32.0–36.0)
MCV: 88.9 fL (ref 80.0–100.0)
MPV: 11.7 fL (ref 7.5–12.5)
Monocytes Relative: 5.5 %
Neutro Abs: 3075 cells/uL (ref 1500–7800)
Neutrophils Relative %: 60.3 %
Platelets: 167 10*3/uL (ref 140–400)
RBC: 5.49 10*6/uL (ref 4.20–5.80)
RDW: 12.3 % (ref 11.0–15.0)
Total Lymphocyte: 31.8 %
WBC: 5.1 10*3/uL (ref 3.8–10.8)

## 2022-08-18 LAB — B12 AND FOLATE PANEL
Folate: 2.9 ng/mL — ABNORMAL LOW
Vitamin B-12: 488 pg/mL (ref 200–1100)

## 2022-08-18 LAB — HEPATITIS C ANTIBODY: Hepatitis C Ab: NONREACTIVE

## 2022-08-18 LAB — COMPLETE METABOLIC PANEL WITH GFR
AG Ratio: 2 (calc) (ref 1.0–2.5)
ALT: 16 U/L (ref 9–46)
AST: 17 U/L (ref 10–35)
Albumin: 4.2 g/dL (ref 3.6–5.1)
Alkaline phosphatase (APISO): 57 U/L (ref 35–144)
BUN: 15 mg/dL (ref 7–25)
CO2: 30 mmol/L (ref 20–32)
Calcium: 9.4 mg/dL (ref 8.6–10.3)
Chloride: 106 mmol/L (ref 98–110)
Creat: 1.01 mg/dL (ref 0.70–1.30)
Globulin: 2.1 g/dL (calc) (ref 1.9–3.7)
Glucose, Bld: 87 mg/dL (ref 65–99)
Potassium: 5.6 mmol/L — ABNORMAL HIGH (ref 3.5–5.3)
Sodium: 141 mmol/L (ref 135–146)
Total Bilirubin: 0.5 mg/dL (ref 0.2–1.2)
Total Protein: 6.3 g/dL (ref 6.1–8.1)
eGFR: 87 mL/min/{1.73_m2} (ref >=60)

## 2022-08-18 LAB — PSA: PSA: 0.58 ng/mL (ref <=4.00)

## 2022-08-18 LAB — LIPID PANEL W/REFLEX DIRECT LDL
Cholesterol: 213 mg/dL — ABNORMAL HIGH (ref <200)
HDL: 52 mg/dL (ref >=40)
LDL Cholesterol (Calc): 142 mg/dL (calc) — ABNORMAL HIGH
Non-HDL Cholesterol (Calc): 161 mg/dL (calc) — ABNORMAL HIGH (ref <130)
Total CHOL/HDL Ratio: 4.1 (calc) (ref <5.0)
Triglycerides: 83 mg/dL (ref <150)

## 2022-08-18 LAB — TSH: TSH: 1.45 mIU/L (ref 0.40–4.50)

## 2022-08-18 LAB — VITAMIN D 25 HYDROXY (VIT D DEFICIENCY, FRACTURES): Vit D, 25-Hydroxy: 82 ng/mL (ref 30–100)

## 2022-08-19 ENCOUNTER — Encounter: Payer: Self-pay | Admitting: Physician Assistant

## 2022-08-19 DIAGNOSIS — E538 Deficiency of other specified B group vitamins: Secondary | ICD-10-CM | POA: Insufficient documentation

## 2022-08-19 NOTE — Progress Notes (Signed)
Jered,  Vitamin D looks great.  Normal WBC and hemoglobin.  Thyroid looks great.  PSA normal and stable.  Testosterone looks great.  B12 looks good.  Folate is low. Suggest folic acid 086VHQ daily.  Cholesterol not to goal and CV risk elevated. Suggest starting a statin. I know you have declined in the past. Have you ever tried one?   Marland KitchenMarland KitchenThe 10-year ASCVD risk score (Arnett DK, et al., 2019) is: 8.8%   Values used to calculate the score:     Age: 57 years     Sex: Male     Is Non-Hispanic African American: No     Diabetic: No     Tobacco smoker: Yes     Systolic Blood Pressure: 469 mmHg     Is BP treated: No     HDL Cholesterol: 52 mg/dL     Total Cholesterol: 213 mg/dL  Potassium was a little elevated needs recheck in 2 weeks.

## 2022-08-23 MED ORDER — ATORVASTATIN CALCIUM 20 MG PO TABS: 20.0000 mg | ORAL_TABLET | Freq: Every day | ORAL | 3 refills | Status: DC

## 2022-08-23 NOTE — Addendum Note (Signed)
Addended by: Donella Stade on: 08/23/2022 04:17 PM   Modules accepted: Orders

## 2022-08-23 NOTE — Progress Notes (Signed)
Sent lipitor 20mg  to take at bedtime daily.

## 2022-09-05 DIAGNOSIS — G43009 Migraine without aura, not intractable, without status migrainosus: Secondary | ICD-10-CM | POA: Diagnosis not present

## 2022-09-05 DIAGNOSIS — M542 Cervicalgia: Secondary | ICD-10-CM | POA: Diagnosis not present

## 2022-09-05 DIAGNOSIS — Z1330 Encounter for screening examination for mental health and behavioral disorders, unspecified: Secondary | ICD-10-CM | POA: Diagnosis not present

## 2022-09-22 ENCOUNTER — Other Ambulatory Visit: Payer: Self-pay | Admitting: *Deleted

## 2022-09-22 DIAGNOSIS — Z87891 Personal history of nicotine dependence: Secondary | ICD-10-CM

## 2022-09-22 DIAGNOSIS — Z122 Encounter for screening for malignant neoplasm of respiratory organs: Secondary | ICD-10-CM

## 2022-09-28 ENCOUNTER — Ambulatory Visit (INDEPENDENT_AMBULATORY_CARE_PROVIDER_SITE_OTHER): Payer: BC Managed Care – PPO | Admitting: Physician Assistant

## 2022-09-28 ENCOUNTER — Encounter: Payer: Self-pay | Admitting: Physician Assistant

## 2022-09-28 DIAGNOSIS — F17210 Nicotine dependence, cigarettes, uncomplicated: Secondary | ICD-10-CM | POA: Diagnosis not present

## 2022-09-28 NOTE — Progress Notes (Signed)
Virtual Visit via Telephone Note  I connected with Jason [name] on 09/28/22 at  9:00 AM EDT by telephone and verified that I am speaking with the correct person using two identifiers.  Location: Patient: home Provider: working virtually from home   I discussed the limitations, risks, security and privacy concerns of performing an evaluation and management service by telephone and the availability of in person appointments. I also discussed with the patient that there may be a patient responsible charge related to this service. The patient expressed understanding and agreed to proceed.        Shared Decision Making Visit Lung Cancer Screening Program [phone])   Eligibility: Age 57 y.o. Pack Years Smoking History Calculation 37 (# packs/per year x # years smoked) Recent History of coughing up blood  no Unexplained weight loss? no ( >Than 15 pounds within the last 6 months ) Prior History Lung / other cancer no (Diagnosis within the last 5 years already requiring surveillance chest CT Scans). Smoking Status Current Smoker Former Smokers: Years since quit: n/a  Quit Date: n/a  Visit Components: Discussion included one or more decision making aids. yes Discussion included risk/benefits of screening. yes Discussion included potential follow up diagnostic testing for abnormal scans. yes Discussion included meaning and risk of over diagnosis. yes Discussion included meaning and risk of False Positives. yes Discussion included meaning of total radiation exposure. yes  Counseling Included: Importance of adherence to annual lung cancer LDCT screening. yes Impact of comorbidities on ability to participate in the program. yes Ability and willingness to under diagnostic treatment. yes  Smoking Cessation Counseling: Current Smokers:  Discussed importance of smoking cessation. yes Information about tobacco cessation classes and interventions provided to patient. yes Patient  provided with "ticket" for LDCT Scan. N/a Symptomatic Patient. no  Counseling n/a Diagnosis Code: Tobacco Use Z72.0 Asymptomatic Patient yes  Counseling (Intermediate counseling: > three minutes counseling) [id number] Information about tobacco cessation classes and interventions provided to patient. Yes Patient provided with "ticket" for LDCT Scan. N/a Written Order for Lung Cancer Screening with LDCT placed in Epic. Yes (CT Chest Lung Cancer Screening Low Dose W/O CM) [id number] Z12.2-Screening of respiratory organs Z87.891-Personal history of nicotine dependence    I have spent 25 minutes of face to face/ virtual visit   time with patient discussing the risks and benefits of lung cancer screening. We viewed / discussed a power point together that explained in detail the above noted topics. We paused at intervals to allow for questions to be asked and answered to ensure understanding.We discussed that the single most powerful action that he can take to decrease his risk of developing lung cancer is to quit smoking. We discussed whether or not he is ready to commit to setting a quit date. We discussed options for tools to aid in quitting smoking including nicotine replacement therapy, non-nicotine medications, support groups, Quit Smart classes, and behavior modification. We discussed that often times setting smaller, more achievable goals, such as eliminating 1 cigarette a day for a week and then 2 cigarettes a day for a week can be helpful in slowly decreasing the number of cigarettes smoked. This allows for a sense of accomplishment as well as providing a clinical benefit. I provided  him  with smoking cessation  information  with contact information for community resources, classes, free nicotine replacement therapy, and access to mobile apps, text messaging, and on-line smoking cessation help. I have also provided  him  the office contact information  in the event he needs to contact me, or the screening  staff. We discussed the time and location of the scan, and that either Doroteo Glassman RN, Joella Prince, RN  or I will call / send a letter with the results within 24-72 hours of receiving them. The patient verbalized understanding of all of  the above and had no further questions upon leaving the office. They have my contact information in the event they have any further questions.  I spent three minutes counseling on smoking cessation and the health risks of continued tobacco abuse.  I explained to the patient that there has been a high incidence of coronary artery disease noted on these exams. I explained that this is a non-gated exam therefore degree or severity cannot be determined. This patient is on statin therapy. I have asked the patient to follow-up with their PCP regarding any incidental finding of coronary artery disease and management with diet or medication as their PCP  feels is clinically indicated. The patient verbalized understanding of the above and had no further questions upon completion of the visit.      Otilio Carpen Sharbel Sahagun, PA-C

## 2022-09-28 NOTE — Patient Instructions (Signed)
Thank you for participating in the [facility] [facility]. It was our pleasure to meet you today. We will call you with the results of your scan within the next few days. Your scan will be assigned a Lung RADS category score by the physicians reading the scans.  This Lung RADS score determines follow up scanning.  See below for description of categories, and follow up screening recommendations. We will be in touch to schedule your follow up screening annually or based on recommendations of our providers. We will fax a copy of your scan results to your Primary Care Physician, or the physician who referred you to the program, to ensure they have the results. Please call the office if you have any questions or concerns regarding your scanning experience or results.  Our office number is [phone]. Please speak with Denise Phelps, RN. , or  Denise Buckner RN, They are  our Lung Cancer Screening RN.'s If They are unavailable when you call, Please leave a message on the voice mail. We will return your call at our earliest convenience.This voice mail is monitored several times a day.  Remember, if your scan is normal, we will scan you annually as long as you continue to meet the criteria for the program. (Age 50-80, Current smoker or smoker who has quit within the last 15 years). If you are a smoker, remember, quitting is the single most powerful action that you can take to decrease your risk of lung cancer and other pulmonary, breathing related problems. We know quitting is hard, and we are here to help.  Please let us know if there is anything we can do to help you meet your goal of quitting. If you are a former smoker, congratulations. We are proud of you! Remain smoke free! Remember you can refer friends or family members through the number above.  We will screen them to make sure they meet criteria for the program. Thank you for helping us take better care of you by  participating in Lung Screening.  You can receive free nicotine replacement therapy ( patches, gum or mints) by calling 1-800-QUIT NOW. Please call so we can get you on the path to becoming  a non-smoker. I know it is hard, but you can do this!  Lung RADS Categories:  Lung RADS 1: no nodules or definitely non-concerning nodules.  Recommendation is for a repeat annual scan in 12 months.  Lung RADS 2:  nodules that are non-concerning in appearance and behavior with a very low likelihood of becoming an active cancer. Recommendation is for a repeat annual scan in 12 months.  Lung RADS 3: nodules that are probably non-concerning , includes nodules with a low likelihood of becoming an active cancer.  Recommendation is for a 6-month repeat screening scan. Often noted after an upper respiratory illness. We will be in touch to make sure you have no questions, and to schedule your 6-month scan.  Lung RADS 4 A: nodules with concerning findings, recommendation is most often for a follow up scan in 3 months or additional testing based on our provider's assessment of the scan. We will be in touch to make sure you have no questions and to schedule the recommended 3 month follow up scan.  Lung RADS 4 B:  indicates findings that are concerning. We will be in touch with you to schedule additional diagnostic testing based on our provider's  assessment of the scan.  Other options for assistance in smoking cessation (   As covered by your insurance benefits)  Hypnosis for smoking cessation  [employer] [phone]  Acupuncture for smoking cessation  [facility] [phone]   

## 2022-09-29 ENCOUNTER — Ambulatory Visit (INDEPENDENT_AMBULATORY_CARE_PROVIDER_SITE_OTHER): Payer: BC Managed Care – PPO

## 2022-09-29 DIAGNOSIS — Z87891 Personal history of nicotine dependence: Secondary | ICD-10-CM

## 2022-09-29 DIAGNOSIS — Z122 Encounter for screening for malignant neoplasm of respiratory organs: Secondary | ICD-10-CM | POA: Diagnosis not present

## 2022-09-29 DIAGNOSIS — F17210 Nicotine dependence, cigarettes, uncomplicated: Secondary | ICD-10-CM | POA: Diagnosis not present

## 2022-10-03 ENCOUNTER — Telehealth: Payer: Self-pay | Admitting: Acute Care

## 2022-10-03 NOTE — Telephone Encounter (Signed)
Received a call report for patient's lung cancer screening CT. Below is a copy of the impression:   IMPRESSION: 1. Nodular parenchymal retraction in the superior segment right lower lobe may be postinfectious or post treatment in etiology. Malignancy cannot be excluded. Recommend clinical correlation. If there are outside prior CT chest exams for comparison, addendum can be rendered. Lung-RADS 4B, suspicious. Additional imaging evaluation or consultation with Pulmonology or Thoracic Surgery recommended. These results will be called to the ordering clinician or representative by the Radiologist Assistant, and communication documented in the PACS or [employer]. 2.  Emphysema (ICD10-J43.9).   Will route to Judson Roch and the lung cancer screen program for follow up.

## 2022-10-03 NOTE — Telephone Encounter (Signed)
Call Report. [facility]. Pls call.

## 2022-10-04 ENCOUNTER — Telehealth: Payer: Self-pay | Admitting: Acute Care

## 2022-10-04 DIAGNOSIS — R911 Solitary pulmonary nodule: Secondary | ICD-10-CM

## 2022-10-04 NOTE — Telephone Encounter (Signed)
Results/ plans faxed to PCP. Will await PET results.

## 2022-10-04 NOTE — Telephone Encounter (Signed)
I have called the patient with the results of the low dose Ct. I explained that his scan was read as a Lung RADS 4 B indicates suspicious findings for which additional diagnostic testing and or tissue sampling is recommended.  There is a Nodular parenchymal retraction in the superior segment right lower lobe measures at least 25.2 mm.It may be postinfectious or post treatment in etiology. Malignancy cannot be excluded.There are no Ct images of the chest to compare this to. Patient confirms this is his first Ct Chest.  I asked patient if he has been sick, and he states that he did have Covid In November 23, but he has not been sick since.  I reviewed the scan with Dr. Valeta Harms, and he feels we should PET scan it to based on appearance.  Patient is in agreement with this plan Langley Gauss, I will order the PET scan. Please fax results to PCP and make sure they know follow up plan. He will need a follow up with me or [facility] to review PET results within a week of the scan. Thanks so much

## 2022-10-04 NOTE — Telephone Encounter (Signed)
Noted. Will be called in through screening program.

## 2022-10-13 ENCOUNTER — Ambulatory Visit (HOSPITAL_COMMUNITY)
Admission: RE | Admit: 2022-10-13 | Discharge: 2022-10-13 | Disposition: A | Discharge status: Home / Self Care | Payer: BC Managed Care – PPO | Source: Ambulatory Visit | Attending: Acute Care | Admitting: Acute Care

## 2022-10-13 DIAGNOSIS — R911 Solitary pulmonary nodule: Secondary | ICD-10-CM | POA: Insufficient documentation

## 2022-10-13 DIAGNOSIS — J181 Lobar pneumonia, unspecified organism: Secondary | ICD-10-CM | POA: Diagnosis not present

## 2022-10-13 LAB — GLUCOSE, CAPILLARY: Glucose-Capillary: 99 mg/dL (ref 70–99)

## 2022-10-13 MED ORDER — FLUDEOXYGLUCOSE F - 18 (FDG) INJECTION
8.0000 | Freq: Once | INTRAVENOUS | Status: AC
  Administered 2022-10-13: 8 via INTRAVENOUS

## 2022-10-21 ENCOUNTER — Encounter: Payer: Self-pay | Admitting: Acute Care

## 2022-10-21 ENCOUNTER — Other Ambulatory Visit: Payer: Self-pay | Admitting: Acute Care

## 2022-10-21 ENCOUNTER — Ambulatory Visit: Payer: BC Managed Care – PPO | Admitting: Acute Care

## 2022-10-21 ENCOUNTER — Encounter: Payer: Self-pay | Admitting: Pulmonary Disease

## 2022-10-21 VITALS — BP 124/76 | HR 98 | Wt 166.6 lb

## 2022-10-21 DIAGNOSIS — R942 Abnormal results of pulmonary function studies: Secondary | ICD-10-CM | POA: Diagnosis not present

## 2022-10-21 DIAGNOSIS — R911 Solitary pulmonary nodule: Secondary | ICD-10-CM

## 2022-10-21 DIAGNOSIS — Z01812 Encounter for preprocedural laboratory examination: Secondary | ICD-10-CM | POA: Diagnosis not present

## 2022-10-21 DIAGNOSIS — F17210 Nicotine dependence, cigarettes, uncomplicated: Secondary | ICD-10-CM

## 2022-10-21 NOTE — H&P (View-Only) (Signed)
History of Present Illness Jason [name] is a 57 y.o. male current every day smoker followed through the Lung cancer screening program. Additional past medical history includes Anxiety, BPH and Hyperlipidemia.    10/21/2022 Pt presents for follow up to review results of PET scan after abnormal Lung Cancer Screening Scan. Initial lung cancer screening scan was done 10/03/2022. It was read as a Lung RADS 4 B indicates suspicious findings for which additional diagnostic testing and or tissue sampling is recommended. He had Nodular parenchymal retraction in the superior segment right lower lobe measures at least 25.2 mm.   Follow up PET imaging was done 10/14/2022. This confirmed the medial portion of the right lower lobe area of nodular parenchymal retraction is hypermetabolic, suspicious for underlying primary bronchogenic carcinoma. Bilateral, symmetric mediastinal and hilar nodal hypermetabolism, greater than that of the presumed lung primary. Although diffuse nodal metastasis cannot be excluded, alternate infectious or inflammatory (sarcoidosis) etiologies are considered. No evidence of extrathoracic metastatic disease.Low-level thyroid hypermetabolism can be seen with thyroiditis.  Pt. Does have significant issues with aspiration. He has has esophageal dilation done in the past, but per his wife he does still aspirate at times. ? If this could be the reason for the hypermetabolism in the lymph nodes, vs the question of sarcoidosis per the radiologist. His father did have polyps in the lungs that required surgery in the 70's, but no formal diagnosis of sarcoid, so ? Family Hx of auto-immune/ connective tissue issues.   We discussed that the best option moving forward is to biopsy the right lower lobe nodule and also the lymph nodes of concern. He and his wife are in agreement with this plan. Orders were placed through [facility] for the  bronch. Plan is for bronch 4/16. Patient is not on any blood thinners  or diabetic medications that will need to be held.    Test Results: PET 10/13/2022 The medial portion of the right lower lobe area of nodular parenchymal retraction is hypermetabolic, suspicious for underlying primary bronchogenic carcinoma. 2. Bilateral, symmetric mediastinal and hilar nodal hypermetabolism, greater than that of the presumed lung primary. Although diffuse nodal metastasis cannot be excluded, alternate infectious or inflammatory (sarcoidosis) etiologies are considered. 3. No evidence of extrathoracic metastatic disease. 4. Low-level thyroid hypermetabolism can be seen with thyroiditis. Consider laboratory correlation.  Low Dose CT Chest  Lung Cancer Screening 09/29/2022  Mediastinum/Nodes: No pathologically enlarged mediastinal or axillary lymph nodes. Hilar regions are difficult to definitively evaluate without IV contrast. Esophagus is grossly unremarkable.   Lungs/Pleura: Centrilobular and paraseptal emphysema. Nodular parenchymal retraction in the superior segment right lower lobe measures at least 25.2 mm (3/165). No pleural fluid. Airway is unremarkable.   Nodular parenchymal retraction in the superior segment right lower lobe may be postinfectious or post treatment in etiology. Malignancy cannot be excluded. Recommend clinical correlation. If there are outside prior CT chest exams for comparison, addendum can be rendered. Lung-RADS 4B, suspicious       Latest Ref Rng & Units 08/17/2022    9:03 AM 04/29/2021    8:42 AM 09/11/2019   10:32 AM  CBC  WBC 3.8 - 10.8 Thousand/uL 5.1  8.8  5.3   Hemoglobin 13.2 - 17.1 g/dL 16.116.5  09.616.8  04.517.0   Hematocrit 38.5 - 50.0 % 48.8  49.7  50.0   Platelets 140 - 400 Thousand/uL 167  166  146        Latest Ref Rng & Units 08/17/2022    9:03  AM 04/29/2021    8:42 AM 09/11/2019   10:32 AM  BMP  Glucose 65 - 99 mg/dL 87  118  93   BUN 7 - 25 mg/dL 15  12  16    Creatinine 0.70 - 1.30 mg/dL 8.67  7.37  3.66   BUN/Creat  Ratio 6 - 22 (calc) SEE NOTE:  NOT APPLICABLE  NOT APPLICABLE   Sodium 135 - 146 mmol/L 141  139  139   Potassium 3.5 - 5.3 mmol/L 5.6  4.0  4.3   Chloride 98 - 110 mmol/L 106  106  106   CO2 20 - 32 mmol/L 30  25  26    Calcium 8.6 - 10.3 mg/dL 9.4  9.0  9.4     BNP No results found for: "BNP"  ProBNP No results found for: "PROBNP"  PFT No results found for: "FEV1PRE", "FEV1POST", "FVCPRE", "FVCPOST", "TLC", "DLCOUNC", "PREFEV1FVCRT", "PSTFEV1FVCRT"  NM PET Image Initial (PI) Skull Base To Thigh  Result Date: 10/14/2022 CLINICAL DATA:  Initial treatment strategy for lung cancer screening CT demonstrating right lower lobe nodular consolidation and parenchymal retraction. EXAM: NUCLEAR MEDICINE PET SKULL BASE TO THIGH TECHNIQUE: 8.0 mCi F-18 FDG was injected intravenously. Full-ring PET imaging was performed from the skull base to thigh after the radiotracer. CT data was obtained and used for attenuation correction and anatomic localization. Fasting blood glucose: 39 mg/dl COMPARISON:  Chest CT of 09/29/2022 FINDINGS: Mediastinal blood pool activity: SUV max 1.1 Liver activity: SUV max NA NECK: Cervical motion between CT and PET images. Given this limitation, no cervical nodal hypermetabolism. Diffuse mild thyroid hypermetabolism at a S.U.V. max of 3.6. Incidental CT findings: No cervical adenopathy. CHEST: The area of right lower lobe consolidation and parenchymal retraction demonstrates focal hypermetabolism in its medial portion at a S.U.V. max of 4.5 on approximately image 89/4. Symmetric bilateral mediastinal and hilar nodal hypermetabolism. Example right paratracheal node at 5 mm and a S.U.V. max of 6.2 on 80/4. Right hilar node of approximately 1.0 cm measures a S.U.V. max of 15.1 including on 86/4. Incidental CT findings: Deferred to recent diagnostic CT. Centrilobular paraseptal emphysema. ABDOMEN/PELVIS: No abdominopelvic parenchymal or nodal hypermetabolism. Incidental CT findings: Normal  adrenal glands. Abdominal aortic atherosclerosis. SKELETON: No abnormal marrow activity. Incidental CT findings: None. IMPRESSION: 1. The medial portion of the right lower lobe area of nodular parenchymal retraction is hypermetabolic, suspicious for underlying primary bronchogenic carcinoma. 2. Bilateral, symmetric mediastinal and hilar nodal hypermetabolism, greater than that of the presumed lung primary. Although diffuse nodal metastasis cannot be excluded, alternate infectious or inflammatory (sarcoidosis) etiologies are considered. 3. No evidence of extrathoracic metastatic disease. 4. Low-level thyroid hypermetabolism can be seen with thyroiditis. Consider laboratory correlation. 5. Incidental findings, including: Aortic atherosclerosis (ICD10-I70.0) and emphysema (ICD10-J43.9). Electronically Signed   By: Jeronimo Greaves M.D.   On: 10/14/2022 17:01   CT CHEST LUNG CA SCREEN LOW DOSE W/O CM  Result Date: 10/03/2022 CLINICAL DATA:  Current smoker, 37 pack-year history. EXAM: CT CHEST WITHOUT CONTRAST LOW-DOSE FOR LUNG CANCER SCREENING TECHNIQUE: Multidetector CT imaging of the chest was performed following the standard protocol without IV contrast. RADIATION DOSE REDUCTION: This exam was performed according to the departmental dose-optimization program which includes automated exposure control, adjustment of the mA and/or kV according to patient size and/or use of iterative reconstruction technique. COMPARISON:  None Available. FINDINGS: Cardiovascular: Heart size normal.  No pericardial effusion. Mediastinum/Nodes: No pathologically enlarged mediastinal or axillary lymph nodes. Hilar regions are difficult to definitively evaluate  without IV contrast. Esophagus is grossly unremarkable. Lungs/Pleura: Centrilobular and paraseptal emphysema. Nodular parenchymal retraction in the superior segment right lower lobe measures at least 25.2 mm (3/165). No pleural fluid. Airway is unremarkable. Upper Abdomen: Visualized  portions of the liver, adrenal glands, kidneys, spleen and stomach are grossly unremarkable. Musculoskeletal: None. IMPRESSION: 1. Nodular parenchymal retraction in the superior segment right lower lobe may be postinfectious or post treatment in etiology. Malignancy cannot be excluded. Recommend clinical correlation. If there are outside prior CT chest exams for comparison, addendum can be rendered. Lung-RADS 4B, suspicious. Additional imaging evaluation or consultation with Pulmonology or Thoracic Surgery recommended. These results will be called to the ordering clinician or representative by the Radiologist Assistant, and communication documented in the PACS or [employer]. 2.  Emphysema (ICD10-J43.9). Electronically Signed   By: Leanna BattlesMelinda  Blietz M.D.   On: 10/03/2022 11:00    Past medical hx Past Medical History:  Diagnosis Date   Anxiety    BPH (benign prostatic hyperplasia) 10/15/2014   Hyperlipidemia 10/16/2014   AHA 10 year risk of 2.8% March 2016      Social History   Tobacco Use   Smoking status: Every Day    Packs/day: 1.00    Years: 37.00    Additional pack years: 0.00    Total pack years: 37.00    Types: Cigarettes   Smokeless tobacco: Never   Tobacco comments:    Pt states he is smoking about 1 ppd. ALS 10/21/22  Vaping Use   Vaping Use: Never used  Substance Use Topics   Alcohol use: Yes    Alcohol/week: 0.0 standard drinks of alcohol    Comment: drinks maybe once a month.     Drug use: No    Jason [name] reports that he has been smoking cigarettes. He has a 37.00 pack-year smoking history. He has never used smokeless tobacco. He reports current alcohol use. He reports that he does not use drugs.  Tobacco Cessation: Current every day smoker with a 37 pack year smoking history. Smoking cessation counseling has been provided  Past surgical hx, Family hx, Social hx all reviewed.  Current Outpatient Medications on File Prior to Visit  Medication Sig   albuterol  (VENTOLIN HFA) 108 (90 Base) MCG/ACT inhaler Inhale 2 puffs into the lungs every 6 (six) hours as needed.   atorvastatin (LIPITOR) 20 MG tablet Take 1 tablet (20 mg total) by mouth at bedtime.   baclofen (LIORESAL) 10 MG tablet Take 1 tablet (10 mg total) by mouth at bedtime as needed for muscle spasms.   buPROPion ER (WELLBUTRIN SR) 100 MG 12 hr tablet Take 1 tablet (100 mg total) by mouth 2 (two) times daily.   famotidine (PEPCID) 40 MG tablet Take 40 mg by mouth at bedtime as needed.   MELATONIN PO Take by mouth.   meloxicam (MOBIC) 15 MG tablet TAKE 1 TABLET BY MOUTH ONCE DAILY   pantoprazole (PROTONIX) 40 MG tablet Take 40 mg by mouth daily.   venlafaxine XR (EFFEXOR XR) 75 MG 24 hr capsule Take 1 capsule (75 mg total) by mouth daily with breakfast.   No current facility-administered medications on file prior to visit.     Allergies  Allergen Reactions   Octacosanol     Sinus and head pressure    Review Of Systems:  Constitutional:   No  weight loss, night sweats,  Fevers, chills, fatigue, or  lassitude.  HEENT:   No headaches,  Difficulty swallowing,  Tooth/dental problems, or  Sore throat,                No sneezing, itching, ear ache, nasal congestion, post nasal drip,   CV:  No chest pain,  Orthopnea, PND, swelling in lower extremities, anasarca, dizziness, palpitations, syncope.   GI  No heartburn, indigestion, abdominal pain, nausea, vomiting, diarrhea, change in bowel habits, loss of appetite, bloody stools.   Resp: No shortness of breath with exertion or at rest.  No excess mucus, + productive cough,  + non-productive cough,  No coughing up of blood.  No change in color of mucus.  No wheezing.  No chest wall deformity  Skin: no rash or lesions.  GU: no dysuria, change in color of urine, no urgency or frequency.  No flank pain, no hematuria   MS:  No joint pain or swelling.  No decreased range of motion.  No back pain.  Psych:  No change in mood or affect. No  depression or anxiety.  No memory loss.   Vital Signs BP 124/76 (BP Location: Left Arm, Patient Position: Sitting, Cuff Size: Normal)   Pulse 98   Wt 166 lb 9.6 oz (75.6 kg)   SpO2 98%   BMI 21.98 kg/m    Physical Exam:  General- No distress,  A&Ox3, pleasant ENT: No sinus tenderness, TM clear, pale nasal mucosa, no oral exudate,no post nasal drip, no LAN Cardiac: S1, S2, regular rate and rhythm, no murmur Chest: No wheeze/ rales/ dullness; no accessory muscle use, no nasal flaring, no sternal retractions Abd.: Soft Non-tender, ND, BS +, Body mass index is 21.98 kg/m.  Ext: No clubbing cyanosis, edema Neuro:  normal strength, MAE x 4, A&O x 3 Skin: No rashes, warm and dry, no lesions  Psych: normal mood and behavior   Assessment/Plan Right lower lobe nodule 2.52 cm concerning for malignancy in current every day smoker, Hypermetabolic per PET scan  Bilateral, symmetric mediastinal and hilar nodal hypermetabolism per PET  Plan Your PET scan shows the nodule of concern in the right lower lobe  We will have ordered a bronchoscopy with biopsies for 4/16 or 4/23. You will get a call to get this scheduled.  You will also get a call to schedule your Super D CT Chest at [facility]. Call if you have any additional questions or concerns. Please contact office for sooner follow up if symptoms do not improve or worsen or seek emergency care   Counseled to quit smoking entirely Will need PFT's if lymph nodes are negative on biopsy  I spent 40 minutes dedicated to the care of this patient on the date of this encounter to include pre-visit review of records, face-to-face time with the patient discussing conditions above, post visit ordering of testing, clinical documentation with the electronic health record, making appropriate referrals as documented, and communicating necessary information to the patient's healthcare team.     Donna Silverman F Suhaylah Wampole, NP 10/21/2022  9:08  AM          

## 2022-10-21 NOTE — Progress Notes (Signed)
History of Present Illness Jason BoucheWalter T [name] is a 57 y.o. male current every day smoker followed through the Lung cancer screening program. Additional past medical history includes Anxiety, BPH and Hyperlipidemia.    10/21/2022 Pt presents for follow up to review results of PET scan after abnormal Lung Cancer Screening Scan. Initial lung cancer screening scan was done 10/03/2022. It was read as a Lung RADS 4 B indicates suspicious findings for which additional diagnostic testing and or tissue sampling is recommended. He had Nodular parenchymal retraction in the superior segment right lower lobe measures at least 25.2 mm.   Follow up PET imaging was done 10/14/2022. This confirmed the medial portion of the right lower lobe area of nodular parenchymal retraction is hypermetabolic, suspicious for underlying primary bronchogenic carcinoma. Bilateral, symmetric mediastinal and hilar nodal hypermetabolism, greater than that of the presumed lung primary. Although diffuse nodal metastasis cannot be excluded, alternate infectious or inflammatory (sarcoidosis) etiologies are considered. No evidence of extrathoracic metastatic disease.Low-level thyroid hypermetabolism can be seen with thyroiditis.  Pt. Does have significant issues with aspiration. He has has esophageal dilation done in the past, but per his wife he does still aspirate at times. ? If this could be the reason for the hypermetabolism in the lymph nodes, vs the question of sarcoidosis per the radiologist. His father did have polyps in the lungs that required surgery in the 70's, but no formal diagnosis of sarcoid, so ? Family Hx of auto-immune/ connective tissue issues.   We discussed that the best option moving forward is to biopsy the right lower lobe nodule and also the lymph nodes of concern. He and his wife are in agreement with this plan. Orders were placed through [facility] for the  bronch. Plan is for bronch 4/16. Patient is not on any blood thinners  or diabetic medications that will need to be held.    Test Results: PET 10/13/2022 The medial portion of the right lower lobe area of nodular parenchymal retraction is hypermetabolic, suspicious for underlying primary bronchogenic carcinoma. 2. Bilateral, symmetric mediastinal and hilar nodal hypermetabolism, greater than that of the presumed lung primary. Although diffuse nodal metastasis cannot be excluded, alternate infectious or inflammatory (sarcoidosis) etiologies are considered. 3. No evidence of extrathoracic metastatic disease. 4. Low-level thyroid hypermetabolism can be seen with thyroiditis. Consider laboratory correlation.  Low Dose CT Chest  Lung Cancer Screening 09/29/2022  Mediastinum/Nodes: No pathologically enlarged mediastinal or axillary lymph nodes. Hilar regions are difficult to definitively evaluate without IV contrast. Esophagus is grossly unremarkable.   Lungs/Pleura: Centrilobular and paraseptal emphysema. Nodular parenchymal retraction in the superior segment right lower lobe measures at least 25.2 mm (3/165). No pleural fluid. Airway is unremarkable.   Nodular parenchymal retraction in the superior segment right lower lobe may be postinfectious or post treatment in etiology. Malignancy cannot be excluded. Recommend clinical correlation. If there are outside prior CT chest exams for comparison, addendum can be rendered. Lung-RADS 4B, suspicious       Latest Ref Rng & Units 08/17/2022    9:03 AM 04/29/2021    8:42 AM 09/11/2019   10:32 AM  CBC  WBC 3.8 - 10.8 Thousand/uL 5.1  8.8  5.3   Hemoglobin 13.2 - 17.1 g/dL 16.116.5  09.616.8  04.517.0   Hematocrit 38.5 - 50.0 % 48.8  49.7  50.0   Platelets 140 - 400 Thousand/uL 167  166  146        Latest Ref Rng & Units 08/17/2022    9:03  AM 04/29/2021    8:42 AM 09/11/2019   10:32 AM  BMP  Glucose 65 - 99 mg/dL 87  118  93   BUN 7 - 25 mg/dL 15  12  16    Creatinine 0.70 - 1.30 mg/dL 8.67  7.37  3.66   BUN/Creat  Ratio 6 - 22 (calc) SEE NOTE:  NOT APPLICABLE  NOT APPLICABLE   Sodium 135 - 146 mmol/L 141  139  139   Potassium 3.5 - 5.3 mmol/L 5.6  4.0  4.3   Chloride 98 - 110 mmol/L 106  106  106   CO2 20 - 32 mmol/L 30  25  26    Calcium 8.6 - 10.3 mg/dL 9.4  9.0  9.4     BNP No results found for: "BNP"  ProBNP No results found for: "PROBNP"  PFT No results found for: "FEV1PRE", "FEV1POST", "FVCPRE", "FVCPOST", "TLC", "DLCOUNC", "PREFEV1FVCRT", "PSTFEV1FVCRT"  NM PET Image Initial (PI) Skull Base To Thigh  Result Date: 10/14/2022 CLINICAL DATA:  Initial treatment strategy for lung cancer screening CT demonstrating right lower lobe nodular consolidation and parenchymal retraction. EXAM: NUCLEAR MEDICINE PET SKULL BASE TO THIGH TECHNIQUE: 8.0 mCi F-18 FDG was injected intravenously. Full-ring PET imaging was performed from the skull base to thigh after the radiotracer. CT data was obtained and used for attenuation correction and anatomic localization. Fasting blood glucose: 39 mg/dl COMPARISON:  Chest CT of 09/29/2022 FINDINGS: Mediastinal blood pool activity: SUV max 1.1 Liver activity: SUV max NA NECK: Cervical motion between CT and PET images. Given this limitation, no cervical nodal hypermetabolism. Diffuse mild thyroid hypermetabolism at a S.U.V. max of 3.6. Incidental CT findings: No cervical adenopathy. CHEST: The area of right lower lobe consolidation and parenchymal retraction demonstrates focal hypermetabolism in its medial portion at a S.U.V. max of 4.5 on approximately image 89/4. Symmetric bilateral mediastinal and hilar nodal hypermetabolism. Example right paratracheal node at 5 mm and a S.U.V. max of 6.2 on 80/4. Right hilar node of approximately 1.0 cm measures a S.U.V. max of 15.1 including on 86/4. Incidental CT findings: Deferred to recent diagnostic CT. Centrilobular paraseptal emphysema. ABDOMEN/PELVIS: No abdominopelvic parenchymal or nodal hypermetabolism. Incidental CT findings: Normal  adrenal glands. Abdominal aortic atherosclerosis. SKELETON: No abnormal marrow activity. Incidental CT findings: None. IMPRESSION: 1. The medial portion of the right lower lobe area of nodular parenchymal retraction is hypermetabolic, suspicious for underlying primary bronchogenic carcinoma. 2. Bilateral, symmetric mediastinal and hilar nodal hypermetabolism, greater than that of the presumed lung primary. Although diffuse nodal metastasis cannot be excluded, alternate infectious or inflammatory (sarcoidosis) etiologies are considered. 3. No evidence of extrathoracic metastatic disease. 4. Low-level thyroid hypermetabolism can be seen with thyroiditis. Consider laboratory correlation. 5. Incidental findings, including: Aortic atherosclerosis (ICD10-I70.0) and emphysema (ICD10-J43.9). Electronically Signed   By: Jeronimo Greaves M.D.   On: 10/14/2022 17:01   CT CHEST LUNG CA SCREEN LOW DOSE W/O CM  Result Date: 10/03/2022 CLINICAL DATA:  Current smoker, 37 pack-year history. EXAM: CT CHEST WITHOUT CONTRAST LOW-DOSE FOR LUNG CANCER SCREENING TECHNIQUE: Multidetector CT imaging of the chest was performed following the standard protocol without IV contrast. RADIATION DOSE REDUCTION: This exam was performed according to the departmental dose-optimization program which includes automated exposure control, adjustment of the mA and/or kV according to patient size and/or use of iterative reconstruction technique. COMPARISON:  None Available. FINDINGS: Cardiovascular: Heart size normal.  No pericardial effusion. Mediastinum/Nodes: No pathologically enlarged mediastinal or axillary lymph nodes. Hilar regions are difficult to definitively evaluate  without IV contrast. Esophagus is grossly unremarkable. Lungs/Pleura: Centrilobular and paraseptal emphysema. Nodular parenchymal retraction in the superior segment right lower lobe measures at least 25.2 mm (3/165). No pleural fluid. Airway is unremarkable. Upper Abdomen: Visualized  portions of the liver, adrenal glands, kidneys, spleen and stomach are grossly unremarkable. Musculoskeletal: None. IMPRESSION: 1. Nodular parenchymal retraction in the superior segment right lower lobe may be postinfectious or post treatment in etiology. Malignancy cannot be excluded. Recommend clinical correlation. If there are outside prior CT chest exams for comparison, addendum can be rendered. Lung-RADS 4B, suspicious. Additional imaging evaluation or consultation with Pulmonology or Thoracic Surgery recommended. These results will be called to the ordering clinician or representative by the Radiologist Assistant, and communication documented in the PACS or [employer] Dashboard. 2.  Emphysema (ICD10-J43.9). Electronically Signed   By: Leanna BattlesMelinda  Blietz M.D.   On: 10/03/2022 11:00    Past medical hx Past Medical History:  Diagnosis Date   Anxiety    BPH (benign prostatic hyperplasia) 10/15/2014   Hyperlipidemia 10/16/2014   AHA 10 year risk of 2.8% March 2016      Social History   Tobacco Use   Smoking status: Every Day    Packs/day: 1.00    Years: 37.00    Additional pack years: 0.00    Total pack years: 37.00    Types: Cigarettes   Smokeless tobacco: Never   Tobacco comments:    Pt states he is smoking about 1 ppd. ALS 10/21/22  Vaping Use   Vaping Use: Never used  Substance Use Topics   Alcohol use: Yes    Alcohol/week: 0.0 standard drinks of alcohol    Comment: drinks maybe once a month.     Drug use: No    Mr.B[place] reports that he has been smoking cigarettes. He has a 37.00 pack-year smoking history. He has never used smokeless tobacco. He reports current alcohol use. He reports that he does not use drugs.  Tobacco Cessation: Current every day smoker with a 37 pack year smoking history. Smoking cessation counseling has been provided  Past surgical hx, Family hx, Social hx all reviewed.  Current Outpatient Medications on File Prior to Visit  Medication Sig   albuterol  (VENTOLIN HFA) 108 (90 Base) MCG/ACT inhaler Inhale 2 puffs into the lungs every 6 (six) hours as needed.   atorvastatin (LIPITOR) 20 MG tablet Take 1 tablet (20 mg total) by mouth at bedtime.   baclofen (LIORESAL) 10 MG tablet Take 1 tablet (10 mg total) by mouth at bedtime as needed for muscle spasms.   buPROPion ER (WELLBUTRIN SR) 100 MG 12 hr tablet Take 1 tablet (100 mg total) by mouth 2 (two) times daily.   famotidine (PEPCID) 40 MG tablet Take 40 mg by mouth at bedtime as needed.   MELATONIN PO Take by mouth.   meloxicam (MOBIC) 15 MG tablet TAKE 1 TABLET BY MOUTH ONCE DAILY   pantoprazole (PROTONIX) 40 MG tablet Take 40 mg by mouth daily.   venlafaxine XR (EFFEXOR XR) 75 MG 24 hr capsule Take 1 capsule (75 mg total) by mouth daily with breakfast.   No current facility-administered medications on file prior to visit.     Allergies  Allergen Reactions   Octacosanol     Sinus and head pressure    Review Of Systems:  Constitutional:   No  weight loss, night sweats,  Fevers, chills, fatigue, or  lassitude.  HEENT:   No headaches,  Difficulty swallowing,  Tooth/dental problems, or  Sore throat,                No sneezing, itching, ear ache, nasal congestion, post nasal drip,   CV:  No chest pain,  Orthopnea, PND, swelling in lower extremities, anasarca, dizziness, palpitations, syncope.   GI  No heartburn, indigestion, abdominal pain, nausea, vomiting, diarrhea, change in bowel habits, loss of appetite, bloody stools.   Resp: No shortness of breath with exertion or at rest.  No excess mucus, + productive cough,  + non-productive cough,  No coughing up of blood.  No change in color of mucus.  No wheezing.  No chest wall deformity  Skin: no rash or lesions.  GU: no dysuria, change in color of urine, no urgency or frequency.  No flank pain, no hematuria   MS:  No joint pain or swelling.  No decreased range of motion.  No back pain.  Psych:  No change in mood or affect. No  depression or anxiety.  No memory loss.   Vital Signs BP 124/76 (BP Location: Left Arm, Patient Position: Sitting, Cuff Size: Normal)   Pulse 98   Wt 166 lb 9.6 oz (75.6 kg)   SpO2 98%   BMI 21.98 kg/m    Physical Exam:  General- No distress,  A&Ox3, pleasant ENT: No sinus tenderness, TM clear, pale nasal mucosa, no oral exudate,no post nasal drip, no LAN Cardiac: S1, S2, regular rate and rhythm, no murmur Chest: No wheeze/ rales/ dullness; no accessory muscle use, no nasal flaring, no sternal retractions Abd.: Soft Non-tender, ND, BS +, Body mass index is 21.98 kg/m.  Ext: No clubbing cyanosis, edema Neuro:  normal strength, MAE x 4, A&O x 3 Skin: No rashes, warm and dry, no lesions  Psych: normal mood and behavior   Assessment/Plan Right lower lobe nodule 2.52 cm concerning for malignancy in current every day smoker, Hypermetabolic per PET scan  Bilateral, symmetric mediastinal and hilar nodal hypermetabolism per PET  Plan Your PET scan shows the nodule of concern in the right lower lobe  We will have ordered a bronchoscopy with biopsies for 4/16 or 4/23. You will get a call to get this scheduled.  You will also get a call to schedule [facility]. Call if you have any additional questions or concerns. Please contact office for sooner follow up if symptoms do not improve or worsen or seek emergency care   Counseled to quit smoking entirely Will need PFT's if lymph nodes are negative on biopsy  I spent 40 minutes dedicated to the care of this patient on the date of this encounter to include pre-visit review of records, face-to-face time with the patient discussing conditions above, post visit ordering of testing, clinical documentation with the electronic health record, making appropriate referrals as documented, and communicating necessary information to the patient's healthcare team.     Bevelyn NgoSarah F Lekeith Wulf, NP 10/21/2022  9:08  AM

## 2022-10-21 NOTE — Patient Instructions (Addendum)
It is good to see you today. Your PET scan shows the nodule of concern in the right lower lobe  We will have ordered a bronchoscopy with biopsies for 4/16 or 4/23. You will get a call to get this scheduled.  You will also get a call to schedule your Super D CT Chest at [facility]. Call if you have any additional questions or concerns. Please contact office for sooner follow up if symptoms do not improve or worsen or seek emergency care   Counseled to quit smoking entirely Will need PFT's if lymph nodes are negative on biopsy

## 2022-10-26 NOTE — Telephone Encounter (Signed)
PCCs confirmed that pre bronch Covid testing must be completed in our office. Pt informed of this via My Chart. Nothing further needed at this time.

## 2022-10-28 ENCOUNTER — Encounter (HOSPITAL_COMMUNITY): Payer: Self-pay | Admitting: Pulmonary Disease

## 2022-10-28 ENCOUNTER — Other Ambulatory Visit: Payer: BC Managed Care – PPO

## 2022-10-28 ENCOUNTER — Other Ambulatory Visit: Payer: Self-pay

## 2022-10-28 DIAGNOSIS — Z01812 Encounter for preprocedural laboratory examination: Secondary | ICD-10-CM

## 2022-10-28 NOTE — Progress Notes (Signed)
SDW CALL  Patient was given pre-op instructions over the phone. Patient verbalized understanding of instructions provided.   PCP - Tandy Gaw PA-C Cardiologist - Denies  Chest CT: 3-14/24 EKG -  Stress Test - 08/10/12 ECHO - 08/10/12 Cardiac Cath - denies  Sleep Study - denies CPAP -   Blood Thinner Instructions: No NSAIDS or Vitamins Aspirin Instructions:  ERAS Protcol - Clears until 0915 PRE-SURGERY Ensure or G2-   COVID TEST- Yes, pt tested 4/12--results pending   Anesthesia review: no  Patient denies shortness of breath, fever, cough and chest pain over the phone call

## 2022-10-30 LAB — SPECIMEN STATUS REPORT

## 2022-10-30 LAB — NOVEL CORONAVIRUS, NAA: SARS-CoV-2, NAA: NOT DETECTED

## 2022-10-31 ENCOUNTER — Telehealth: Payer: Self-pay | Admitting: Pulmonary Disease

## 2022-10-31 NOTE — Telephone Encounter (Signed)
PT wife is calling on behalf of husband. States he was told to go off ALL meds before upcoming surgery starting last Friday. She wants to confirm this because one of the meds he should stay on all the time.  Her # is [phone]

## 2022-10-31 NOTE — Telephone Encounter (Signed)
Spoke with patient advised he can take all medications in the morning with a sup of water. He verbalized understanding. NFN

## 2022-10-31 NOTE — Telephone Encounter (Signed)
Spoke with patients wife she is wondering if patient is supposed to quit taking all medication before procedure? She wasn't sure if it was safe for him to not be on the effexor. He's doing fine, she just wanted to make sure. She is aware procedure is tomorrow.   Dr. Tonia Brooms can you please advise?

## 2022-11-01 ENCOUNTER — Ambulatory Visit (HOSPITAL_COMMUNITY): Payer: BC Managed Care – PPO | Admitting: Certified Registered Nurse Anesthetist

## 2022-11-01 ENCOUNTER — Ambulatory Visit (HOSPITAL_COMMUNITY)
Admission: RE | Admit: 2022-11-01 | Discharge: 2022-11-01 | Disposition: A | Discharge status: Home / Self Care | Payer: BC Managed Care – PPO | Attending: Pulmonary Disease | Admitting: Pulmonary Disease

## 2022-11-01 ENCOUNTER — Encounter (HOSPITAL_COMMUNITY): Payer: Self-pay | Admitting: Pulmonary Disease

## 2022-11-01 ENCOUNTER — Encounter (HOSPITAL_COMMUNITY)
Admission: RE | Disposition: A | Discharge status: Home / Self Care | Payer: Self-pay | Source: Home / Self Care | Attending: Pulmonary Disease

## 2022-11-01 ENCOUNTER — Other Ambulatory Visit: Payer: Self-pay

## 2022-11-01 DIAGNOSIS — F17200 Nicotine dependence, unspecified, uncomplicated: Secondary | ICD-10-CM | POA: Diagnosis not present

## 2022-11-01 DIAGNOSIS — I888 Other nonspecific lymphadenitis: Secondary | ICD-10-CM | POA: Insufficient documentation

## 2022-11-01 DIAGNOSIS — F32A Depression, unspecified: Secondary | ICD-10-CM | POA: Diagnosis not present

## 2022-11-01 DIAGNOSIS — F17210 Nicotine dependence, cigarettes, uncomplicated: Secondary | ICD-10-CM | POA: Diagnosis not present

## 2022-11-01 DIAGNOSIS — J432 Centrilobular emphysema: Secondary | ICD-10-CM | POA: Insufficient documentation

## 2022-11-01 DIAGNOSIS — R590 Localized enlarged lymph nodes: Secondary | ICD-10-CM | POA: Diagnosis not present

## 2022-11-01 DIAGNOSIS — F419 Anxiety disorder, unspecified: Secondary | ICD-10-CM | POA: Insufficient documentation

## 2022-11-01 DIAGNOSIS — F418 Other specified anxiety disorders: Secondary | ICD-10-CM | POA: Diagnosis not present

## 2022-11-01 DIAGNOSIS — R599 Enlarged lymph nodes, unspecified: Secondary | ICD-10-CM | POA: Diagnosis present

## 2022-11-01 DIAGNOSIS — J438 Other emphysema: Secondary | ICD-10-CM | POA: Insufficient documentation

## 2022-11-01 DIAGNOSIS — J449 Chronic obstructive pulmonary disease, unspecified: Secondary | ICD-10-CM | POA: Diagnosis not present

## 2022-11-01 DIAGNOSIS — K219 Gastro-esophageal reflux disease without esophagitis: Secondary | ICD-10-CM | POA: Diagnosis not present

## 2022-11-01 HISTORY — DX: Gastro-esophageal reflux disease without esophagitis: K21.9

## 2022-11-01 HISTORY — PX: FINE NEEDLE ASPIRATION: SHX5430

## 2022-11-01 HISTORY — DX: Headache, unspecified: R51.9

## 2022-11-01 HISTORY — PX: VIDEO BRONCHOSCOPY WITH ENDOBRONCHIAL ULTRASOUND: SHX6177

## 2022-11-01 HISTORY — DX: Attention-deficit hyperactivity disorder, unspecified type: F90.9

## 2022-11-01 HISTORY — DX: Depression, unspecified: F32.A

## 2022-11-01 HISTORY — DX: Chronic obstructive pulmonary disease, unspecified: J44.9

## 2022-11-01 SURGERY — BRONCHOSCOPY, WITH EBUS
Anesthesia: General | Laterality: Right

## 2022-11-01 SURGERY — Surgical Case
Anesthesia: *Unknown

## 2022-11-01 MED ORDER — CHLORHEXIDINE GLUCONATE 0.12 % MT SOLN
OROMUCOSAL | Status: AC
  Filled 2022-11-01: qty 15

## 2022-11-01 MED ORDER — LIDOCAINE 2% (20 MG/ML) 5 ML SYRINGE
INTRAMUSCULAR | Status: DC | PRN
  Administered 2022-11-01: 60 mg via INTRAVENOUS

## 2022-11-01 MED ORDER — CHLORHEXIDINE GLUCONATE 0.12 % MT SOLN
15.0000 mL | Freq: Once | OROMUCOSAL | Status: AC
  Administered 2022-11-01: 15 mL via OROMUCOSAL

## 2022-11-01 MED ORDER — SUGAMMADEX SODIUM 200 MG/2ML IV SOLN
INTRAVENOUS | Status: DC | PRN
  Administered 2022-11-01: 200 mg via INTRAVENOUS

## 2022-11-01 MED ORDER — PROPOFOL 10 MG/ML IV BOLUS
INTRAVENOUS | Status: DC | PRN
  Administered 2022-11-01: 150 mg via INTRAVENOUS

## 2022-11-01 MED ORDER — PROPOFOL 500 MG/50ML IV EMUL
INTRAVENOUS | Status: DC | PRN
  Administered 2022-11-01: 125 ug/kg/min via INTRAVENOUS

## 2022-11-01 MED ORDER — ONDANSETRON HCL 4 MG/2ML IJ SOLN
INTRAMUSCULAR | Status: DC | PRN
  Administered 2022-11-01: 4 mg via INTRAVENOUS

## 2022-11-01 MED ORDER — ROCURONIUM BROMIDE 10 MG/ML (PF) SYRINGE
PREFILLED_SYRINGE | INTRAVENOUS | Status: DC | PRN
  Administered 2022-11-01: 50 mg via INTRAVENOUS
  Administered 2022-11-01: 20 mg via INTRAVENOUS

## 2022-11-01 MED ORDER — LACTATED RINGERS IV SOLN: INTRAVENOUS | Status: DC

## 2022-11-01 MED ORDER — MIDAZOLAM HCL 5 MG/5ML IJ SOLN
INTRAMUSCULAR | Status: DC | PRN
  Administered 2022-11-01: 2 mg via INTRAVENOUS

## 2022-11-01 MED ORDER — MIDAZOLAM HCL (PF) 5 MG/ML IJ SOLN
INTRAMUSCULAR | Status: AC
  Filled 2022-11-01: qty 1

## 2022-11-01 SURGICAL SUPPLY — 29 items
BRUSH CYTOL CELLEBRITY 1.5X140 (MISCELLANEOUS) IMPLANT
CANISTER SUCT 3000ML PPV (MISCELLANEOUS) ×3 IMPLANT
CONT SPEC 4OZ CLIKSEAL STRL BL (MISCELLANEOUS) ×3 IMPLANT
COVER BACK TABLE 60X90IN (DRAPES) ×3 IMPLANT
COVER DOME SNAP 22 D (MISCELLANEOUS) ×3 IMPLANT
FORCEPS BIOP RJ4 1.8 (CUTTING FORCEPS) IMPLANT
GAUZE SPONGE 4X4 12PLY STRL (GAUZE/BANDAGES/DRESSINGS) ×3 IMPLANT
GLOVE BIO SURGEON STRL SZ7.5 (GLOVE) ×3 IMPLANT
GOWN STRL REUS W/ TWL LRG LVL3 (GOWN DISPOSABLE) ×3 IMPLANT
GOWN STRL REUS W/TWL LRG LVL3 (GOWN DISPOSABLE) ×2
KIT CLEAN ENDO COMPLIANCE (KITS) ×6 IMPLANT
KIT TURNOVER KIT B (KITS) ×3 IMPLANT
MARKER SKIN DUAL TIP RULER LAB (MISCELLANEOUS) ×3 IMPLANT
NDL EBUS SONO TIP PENTAX (NEEDLE) ×3 IMPLANT
NEEDLE EBUS SONO TIP PENTAX (NEEDLE) ×2 IMPLANT
NS IRRIG 1000ML POUR BTL (IV SOLUTION) ×3 IMPLANT
OIL SILICONE PENTAX (PARTS (SERVICE/REPAIRS)) ×3 IMPLANT
PAD ARMBOARD 7.5X6 YLW CONV (MISCELLANEOUS) ×6 IMPLANT
SOL ANTI FOG 6CC (MISCELLANEOUS) ×3 IMPLANT
SYR 20CC LL (SYRINGE) ×6 IMPLANT
SYR 20ML ECCENTRIC (SYRINGE) ×6 IMPLANT
SYR 50ML SLIP (SYRINGE) IMPLANT
SYR 5ML LUER SLIP (SYRINGE) ×3 IMPLANT
TOWEL OR 17X24 6PK STRL BLUE (TOWEL DISPOSABLE) ×3 IMPLANT
TRAP SPECIMEN MUCOUS 40CC (MISCELLANEOUS) IMPLANT
TUBE CONNECTING 20X1/4 (TUBING) ×6 IMPLANT
UNDERPAD 30X30 (UNDERPADS AND DIAPERS) ×3 IMPLANT
VALVE DISPOSABLE (MISCELLANEOUS) ×3 IMPLANT
WATER STERILE IRR 1000ML POUR (IV SOLUTION) ×3 IMPLANT

## 2022-11-01 NOTE — Interval H&P Note (Signed)
History and Physical Interval Note:  11/01/2022 10:45 AM  Jason [name]  has presented today for surgery, with the diagnosis of RIGHT LOWER LOPE NODULE AND LYMPH NODE.  The various methods of treatment have been discussed with the patient and family. After consideration of risks, benefits and other options for treatment, the patient has consented to  Procedure(s): VIDEO BRONCHOSCOPY WITH ENDOBRONCHIAL ULTRASOUND (Right) as a surgical intervention.  The patient's history has been reviewed, patient examined, no change in status, stable for surgery.  I have reviewed the patient's chart and labs.  Questions were answered to the patient's satisfaction.     [name]

## 2022-11-01 NOTE — Discharge Instructions (Signed)

## 2022-11-01 NOTE — Op Note (Signed)
Video Bronchoscopy with Endobronchial Ultrasound Procedure Note  Date of Operation: 11/01/2022  Pre-op Diagnosis: Adenopathy  Post-op Diagnosis: Adenopathy  Surgeon:Poppy Mcafee L Stephanny Tsutsui, DO  Assistants: None   Anesthesia: General endotracheal anesthesia  Operation: Flexible video fiberoptic bronchoscopy with endobronchial ultrasound and biopsies.  Estimated Blood Loss: Minimal  Complications: None   Indications and History: Jason [name] is a 57 y.o. male with a hypermetabolic adenopathy on PET.  The risks, benefits, complications, treatment options and expected outcomes were discussed with the patient.  The possibilities of pneumothorax, pneumonia, reaction to medication, pulmonary aspiration, perforation of a viscus, bleeding, failure to diagnose a condition and creating a complication requiring transfusion or operation were discussed with the patient who freely signed the consent.    Description of Procedure: The patient was examined in the preoperative area and history and data from the preprocedure consultation were reviewed. It was deemed appropriate to proceed.  The patient was taken to [facility] endoscopy room 3, identified as Jason [name] and the procedure verified as Flexible Video Fiberoptic Bronchoscopy.  A Time Out was held and the above information confirmed. After being taken to the operating room general anesthesia was initiated and the patient  was orally intubated. The video fiberoptic bronchoscope was introduced via the endotracheal tube and a general inspection was performed which showed normal right left lung anatomy no evidence endobronchial lesion. The standard scope was then withdrawn and the endobronchial ultrasound was used to identify and characterize the peritracheal, hilar and bronchial lymph nodes. Inspection showed small enlarged nodes in the bilateral hilum and subcarinal space. Using real-time ultrasound guidance Wang needle biopsies were take from Station 4R,  7 nodes and were sent for cytology. The patient tolerated the procedure well without apparent complications. There was no significant blood loss. The bronchoscope was withdrawn. Anesthesia was reversed and the patient was taken to the PACU for recovery.   Samples: 1. Wang needle biopsies from Station 4R node 2. Wang needle biopsies from Station 7 node  Plans:  The patient will be discharged from the PACU to home when recovered from anesthesia. We will review the cytology, pathology results with the patient when they become available. Outpatient followup will be with Josephine Igo, DO.    Josephine Igo, DO [facility] 11/01/2022 12:07 PM

## 2022-11-01 NOTE — Anesthesia Preprocedure Evaluation (Signed)
Anesthesia Evaluation  Patient identified by MRN, date of birth, ID band Patient awake    Reviewed: Allergy & Precautions, H&P , NPO status , Patient's Chart, lab work & pertinent test results  Airway Mallampati: II  TM Distance: >3 FB Neck ROM: Full    Dental no notable dental hx.    Pulmonary COPD, Current Smoker and Patient abstained from smoking.   Pulmonary exam normal breath sounds clear to auscultation       Cardiovascular negative cardio ROS Normal cardiovascular exam Rhythm:Regular Rate:Normal     Neuro/Psych  Headaches  Anxiety Depression     negative psych ROS   GI/Hepatic Neg liver ROS,GERD  ,,  Endo/Other  negative endocrine ROS    Renal/GU negative Renal ROS  negative genitourinary   Musculoskeletal  (+) Arthritis , Osteoarthritis,    Abdominal   Peds negative pediatric ROS (+)  Hematology negative hematology ROS (+)   Anesthesia Other Findings Lung nodule  Reproductive/Obstetrics negative OB ROS                             Anesthesia Physical Anesthesia Plan  ASA: 3  Anesthesia Plan: General   Post-op Pain Management: Minimal or no pain anticipated   Induction: Intravenous  PONV Risk Score and Plan: 1 and Ondansetron and Treatment may vary due to age or medical condition  Airway Management Planned: Oral ETT  Additional Equipment:   Intra-op Plan:   Post-operative Plan: Extubation in OR  Informed Consent: I have reviewed the patients History and Physical, chart, labs and discussed the procedure including the risks, benefits and alternatives for the proposed anesthesia with the patient or authorized representative who has indicated his/her understanding and acceptance.     Dental advisory given  Plan Discussed with: CRNA  Anesthesia Plan Comments:        Anesthesia Quick Evaluation

## 2022-11-01 NOTE — Anesthesia Postprocedure Evaluation (Signed)
Anesthesia Post Note  Patient: Jason [name]  Procedure(s) Performed: VIDEO BRONCHOSCOPY WITH ENDOBRONCHIAL ULTRASOUND (Right) FINE NEEDLE ASPIRATION (FNA) LINEAR     Patient location during evaluation: PACU Anesthesia Type: General Level of consciousness: awake and alert Pain management: pain level controlled Vital Signs Assessment: post-procedure vital signs reviewed and stable Respiratory status: spontaneous breathing, nonlabored ventilation and respiratory function stable Cardiovascular status: blood pressure returned to baseline and stable Postop Assessment: no apparent nausea or vomiting Anesthetic complications: no   No notable events documented.  Last Vitals:  Vitals:   11/01/22 1220 11/01/22 1230  BP: (!) 101/46 111/72  Pulse: 78 70  Resp: 18 12  Temp:    SpO2: 99% 97%    Last Pain:  Vitals:   11/01/22 1230  TempSrc:   PainSc: 0-No pain                 Lowella Curb

## 2022-11-01 NOTE — Transfer of Care (Signed)
Immediate Anesthesia Transfer of Care Note  Patient: Jason [name]  Procedure(s) Performed: VIDEO BRONCHOSCOPY WITH ENDOBRONCHIAL ULTRASOUND (Right) FINE NEEDLE ASPIRATION (FNA) LINEAR  Patient Location: Endoscopy Unit  Anesthesia Type:General  Level of Consciousness: awake, alert , patient cooperative, and responds to stimulation  Airway & Oxygen Therapy: Patient Spontanous Breathing and Patient connected to nasal cannula oxygen  Post-op Assessment: Report given to RN and Post -op Vital signs reviewed and stable  Post vital signs: Reviewed and stable  Last Vitals:  Vitals Value Taken Time  BP    Temp    Pulse    Resp    SpO2      Last Pain:  Vitals:   11/01/22 1015  TempSrc:   PainSc: 2       Patients Stated Pain Goal: 0 (11/01/22 1015)  Complications: No notable events documented.

## 2022-11-01 NOTE — Progress Notes (Signed)
Received a call from the lab that patient's CBC clotted. Dr. Nevada Crane with anesthesia made aware. Per Dr. Hyacinth Meeker, ok to proceed without recollecting CBC.

## 2022-11-02 LAB — CYTOLOGY - NON PAP

## 2022-11-02 NOTE — Progress Notes (Signed)
GREAT news. No malignant cells.

## 2022-11-03 ENCOUNTER — Encounter (HOSPITAL_COMMUNITY): Payer: Self-pay | Admitting: Pulmonary Disease

## 2022-11-08 ENCOUNTER — Other Ambulatory Visit: Payer: Self-pay | Admitting: *Deleted

## 2022-11-08 ENCOUNTER — Ambulatory Visit: Payer: BC Managed Care – PPO | Admitting: Acute Care

## 2022-11-08 ENCOUNTER — Encounter: Payer: Self-pay | Admitting: Acute Care

## 2022-11-08 VITALS — BP 124/76 | HR 78 | Ht 73.0 in | Wt 163.0 lb

## 2022-11-08 DIAGNOSIS — R911 Solitary pulmonary nodule: Secondary | ICD-10-CM

## 2022-11-08 DIAGNOSIS — F17210 Nicotine dependence, cigarettes, uncomplicated: Secondary | ICD-10-CM

## 2022-11-08 DIAGNOSIS — Z87891 Personal history of nicotine dependence: Secondary | ICD-10-CM

## 2022-11-08 NOTE — Patient Instructions (Addendum)
It is good to see you today. The biopsy was negative for malignant cells which is great news.  We will do a 6 month follow up low dose CT Chest. This will be due end of September 2024. You will get a call closer to the time to get this scheduled.  We will do an ACE level today I will call you with the results Follow up in 6 months after CT Chest Please work on quitting smoking.  You can receive free nicotine replacement therapy ( patches, gum or mints) by calling 1-800-QUIT NOW. Please call so we can get you on the path to becoming  a non-smoker. I know it is hard, but you can do this!  Hypnosis for smoking cessation  [employer] [phone]  Acupuncture for smoking cessation  [employer] [phone]

## 2022-11-08 NOTE — Progress Notes (Signed)
History of Present Illness Jason [name] is a 57 y.o. male current every day smoker followed through the Lung cancer screening program. Additional past medical history includes Anxiety, BPH and Hyperlipidemia.      11/08/2022 Pt. Presents for follow up after bronchoscopy for evaluation of RLL lung nodule and lymph node ( 4 B) lung screening scan that was also hypermetabolic on PET scan. He underwent video bronchoscopy with endobronchial ultrasound for tissue sampling on 11/01/2022. His biopsy of both the RLL and 11R lymph node was negative for malignant cells, cytology showed granulomatous inflammation. He had some scant bleeding for about 12 hours after the procedure which cleared on its own. He also had some throat pain that cleared within a day. He denies any signs or symptoms of infection, no fever or change in secretion burden. No discolored secretions. We discussed that his father had some kind of  pulmonary polyps that required surgery, but the family do not know definitively what these were. He states he does have some dyspnea and fatigue that he has noted over the last 2 years. He has had this evaluated by his PCP. We discussed whether this could be sarcoid. We are checking an ACE level to see what his baseline is. The only symptom he has is fatigue.  Pt. States he is planning on quitting smoking. This has been a scare and he and his wife both feel this is a sign for them to really work on quitting.   Test Results: Cytology 11/01/2022 LYMPH NODE, 11R, FINE NEEDLE ASPIRATION:  - No malignant cells identified  - Granulomatous inflammation   B. LYMPH NODE, 7, FINE NEEDLE ASPIRATION:  - No malignant cells identified  - Granulomatous inflammation   PET 10/13/2022 The medial portion of the right lower lobe area of nodular parenchymal retraction is hypermetabolic, suspicious for underlying primary bronchogenic carcinoma. 2. Bilateral, symmetric mediastinal and hilar nodal  hypermetabolism, greater than that of the presumed lung primary. Although diffuse nodal metastasis cannot be excluded, alternate infectious or inflammatory (sarcoidosis) etiologies are considered. 3. No evidence of extrathoracic metastatic disease. 4. Low-level thyroid hypermetabolism can be seen with thyroiditis. Consider laboratory correlation. 5. Incidental findings, including: Aortic atherosclerosis  09/29/2022 LDCT Nodular parenchymal retraction in the superior segment right lower lobe may be postinfectious or post treatment in etiology. Malignancy cannot be excluded. Recommend clinical correlation. If there are outside prior CT chest exams for comparison, addendum can be rendered. Lung-RADS 4B, suspicious.      Latest Ref Rng & Units 08/17/2022    9:03 AM 04/29/2021    8:42 AM 09/11/2019   10:32 AM  CBC  WBC 3.8 - 10.8 Thousand/uL 5.1  8.8  5.3   Hemoglobin 13.2 - 17.1 g/dL 60.4  54.0  98.1   Hematocrit 38.5 - 50.0 % 48.8  49.7  50.0   Platelets 140 - 400 Thousand/uL 167  166  146        Latest Ref Rng & Units 08/17/2022    9:03 AM 04/29/2021    8:42 AM 09/11/2019   10:32 AM  BMP  Glucose 65 - 99 mg/dL 87  191  93   BUN 7 - 25 mg/dL Creatinine 0.70 - 1.30 mg/dL 4.78  2.95  6.21   BUN/Creat Ratio 6 - 22 (calc) SEE NOTE:  NOT APPLICABLE  NOT APPLICABLE   Sodium 135 - 146 mmol/L 141  139  139   Potassium 3.5 - 5.3 mmol/L 5.6  4.0  4.3   Chloride 98 - 110 mmol/L 106  106  106   CO2 20 - 32 mmol/L 30  25  26    Calcium 8.6 - 10.3 mg/dL 9.4  9.0  9.4     BNP No results found for: "BNP"  ProBNP No results found for: "PROBNP"  PFT No results found for: "FEV1PRE", "FEV1POST", "FVCPRE", "FVCPOST", "TLC", "DLCOUNC", "PREFEV1FVCRT", "PSTFEV1FVCRT"  NM PET Image Initial (PI) Skull Base To Thigh  Result Date: 10/14/2022 CLINICAL DATA:  Initial treatment strategy for lung cancer screening CT demonstrating right lower lobe nodular consolidation and parenchymal  retraction. EXAM: NUCLEAR MEDICINE PET SKULL BASE TO THIGH TECHNIQUE: 8.0 mCi F-18 FDG was injected intravenously. Full-ring PET imaging was performed from the skull base to thigh after the radiotracer. CT data was obtained and used for attenuation correction and anatomic localization. Fasting blood glucose: 39 mg/dl COMPARISON:  Chest CT of 09/29/2022 FINDINGS: Mediastinal blood pool activity: SUV max 1.1 Liver activity: SUV max NA NECK: Cervical motion between CT and PET images. Given this limitation, no cervical nodal hypermetabolism. Diffuse mild thyroid hypermetabolism at a S.U.V. max of 3.6. Incidental CT findings: No cervical adenopathy. CHEST: The area of right lower lobe consolidation and parenchymal retraction demonstrates focal hypermetabolism in its medial portion at a S.U.V. max of 4.5 on approximately image 89/4. Symmetric bilateral mediastinal and hilar nodal hypermetabolism. Example right paratracheal node at 5 mm and a S.U.V. max of 6.2 on 80/4. Right hilar node of approximately 1.0 cm measures a S.U.V. max of 15.1 including on 86/4. Incidental CT findings: Deferred to recent diagnostic CT. Centrilobular paraseptal emphysema. ABDOMEN/PELVIS: No abdominopelvic parenchymal or nodal hypermetabolism. Incidental CT findings: Normal adrenal glands. Abdominal aortic atherosclerosis. SKELETON: No abnormal marrow activity. Incidental CT findings: None. IMPRESSION: 1. The medial portion of the right lower lobe area of nodular parenchymal retraction is hypermetabolic, suspicious for underlying primary bronchogenic carcinoma. 2. Bilateral, symmetric mediastinal and hilar nodal hypermetabolism, greater than that of the presumed lung primary. Although diffuse nodal metastasis cannot be excluded, alternate infectious or inflammatory (sarcoidosis) etiologies are considered. 3. No evidence of extrathoracic metastatic disease. 4. Low-level thyroid hypermetabolism can be seen with thyroiditis. Consider laboratory  correlation. 5. Incidental findings, including: Aortic atherosclerosis (ICD10-I70.0) and emphysema (ICD10-J43.9). Electronically Signed   By: Jeronimo Greaves M.D.   On: 10/14/2022 17:01     Past medical hx Past Medical History:  Diagnosis Date   ADHD (attention deficit hyperactivity disorder)    Anxiety    BPH (benign prostatic hyperplasia) 10/15/2014   COPD (chronic obstructive pulmonary disease)    Depression    GERD (gastroesophageal reflux disease)    Headache    Hyperlipidemia 10/16/2014   AHA 10 year risk of 2.8% March 2016      Social History   Tobacco Use   Smoking status: Every Day    Packs/day: 1.00    Years: 37.00    Additional pack years: 0.00    Total pack years: 37.00    Types: Cigarettes   Smokeless tobacco: Never   Tobacco comments:    Pt states he is smoking about 1 ppd. ALS 10/21/22  Vaping Use   Vaping Use: Never used  Substance Use Topics   Alcohol use: Yes    Alcohol/week: 0.0 standard drinks of alcohol    Comment: drinks maybe once a month.     Drug use: No    Mr.Horlacher reports that he has been smoking cigarettes. He has a 37.00 pack-year smoking history.  He has never used smokeless tobacco. He reports current alcohol use. He reports that he does not use drugs.  Tobacco Cessation: Current every day smoker , currently working on quitting  Past surgical hx, Family hx, Social hx all reviewed.  Current Outpatient Medications on File Prior to Visit  Medication Sig   acetaminophen (TYLENOL) 500 MG tablet Take 500 mg by mouth every 6 (six) hours as needed for moderate pain.   ARTIFICIAL TEAR SOLUTION OP Place 1 drop into both eyes daily as needed (irritation).   atorvastatin (LIPITOR) 20 MG tablet Take 1 tablet (20 mg total) by mouth at bedtime.   B COMPLEX VITAMINS SL Place 1 Dose under the tongue 3 (three) times a week.   baclofen (LIORESAL) 10 MG tablet Take 1 tablet (10 mg total) by mouth at bedtime as needed for muscle spasms.   buPROPion ER  (WELLBUTRIN SR) 100 MG 12 hr tablet Take 1 tablet (100 mg total) by mouth 2 (two) times daily.   famotidine (PEPCID) 40 MG tablet Take 40 mg by mouth daily.   loratadine (CLARITIN) 10 MG tablet Take 10 mg by mouth daily as needed for allergies.   meloxicam (MOBIC) 15 MG tablet TAKE 1 TABLET BY MOUTH ONCE DAILY   rizatriptan (MAXALT) 10 MG tablet Take 10 mg by mouth as needed for migraine.   triamcinolone cream (KENALOG) 0.1 % Apply 1 Application topically 2 (two) times daily as needed (eczema).   venlafaxine XR (EFFEXOR XR) 75 MG 24 hr capsule Take 1 capsule (75 mg total) by mouth daily with breakfast.   No current facility-administered medications on file prior to visit.     Allergies  Allergen Reactions   Octacosanol     Sinus and head pressure    Review Of Systems:  Constitutional:   No  weight loss, night sweats,  Fevers, chills, +fatigue, or  lassitude.  HEENT:   No headaches,  Difficulty swallowing,  Tooth/dental problems, or  Sore throat,                No sneezing, itching, ear ache, nasal congestion, post nasal drip,   CV:  No chest pain,  Orthopnea, PND, swelling in lower extremities, anasarca, dizziness, palpitations, syncope.   GI  No heartburn, indigestion, abdominal pain, nausea, vomiting, diarrhea, change in bowel habits, loss of appetite, bloody stools.   Resp: + occasional  shortness of breath with exertion none at rest.  No excess mucus, no productive cough,  No non-productive cough,  No coughing up of blood.  No change in color of mucus.  No wheezing.  No chest wall deformity  Skin: no rash or lesions.  GU: no dysuria, change in color of urine, no urgency or frequency.  No flank pain, no hematuria   MS:  No joint pain or swelling.  No decreased range of motion.  No back pain.  Psych:  No change in mood or affect. No depression or anxiety.  No memory loss.   Vital Signs BP 124/76   Pulse 78   Ht  (1.854 m)   Wt 163 lb (73.9 kg)   SpO2 96% Comment: on  RA  BMI 21.51 kg/m    Physical Exam:  General- No distress,  A&O x 3, pleasant ENT: No sinus tenderness, TM clear, pale nasal mucosa, no oral exudate,no post nasal drip, no LAN Cardiac: S1, S2, regular rate and rhythm, no murmur Chest: No wheeze/ rales/ dullness; no accessory muscle use, no nasal flaring, no sternal  retractions Abd.: Soft Non-tender, ND, BS +, Body mass index is 21.51 kg/m.  Ext: No clubbing cyanosis, edema Neuro:  normal strength, MAE x 4, A&O x 3, appropriate Skin: No rashes, warm and dry, no lesions  Psych: normal mood and behavior   Assessment/Plan  Abnormal Lung Cancer Screening  Abnormal PET scan No malignant cells identified  Granulomatous inflammation  Plan It is good to see you today. The biopsy was negative for malignant cells which is great news.  We will do a 6 month follow up low dose CT Chest. This will be due end of September 2024. You will get a call closer to the time to get this scheduled.  We will do an ACE level today I will call you with the results Follow up in 6 months after CT Chest Please work on quitting smoking.  You can receive free nicotine replacement therapy ( patches, gum or mints) by calling 1-800-QUIT NOW. Please call so we can get you on the path to becoming  a non-smoker. I know it is hard, but you can do this!  Hypnosis for smoking cessation  [employer] [phone]  Acupuncture for smoking cessation  [employer] [phone]    I spent 35 minutes dedicated to the care of this patient on the date of this encounter to include pre-visit review of records, face-to-face time with the patient discussing conditions above, post visit ordering of testing, clinical documentation with the electronic health record, making appropriate referrals as documented, and communicating necessary information to the patient's healthcare team.   Bevelyn Ngo, NP 11/08/2022  9:17 AM

## 2022-11-09 LAB — ANGIOTENSIN CONVERTING ENZYME: Angiotensin-Converting Enzyme: 36 U/L (ref 9–67)

## 2022-11-09 NOTE — Progress Notes (Signed)
Erskine Squibb,   Do you want to see in consult for sarcoidosis diagnosis?   Thanks,  BLI  Josephine Igo, DO [facility] 11/09/2022 10:55 AM

## 2022-12-10 ENCOUNTER — Encounter: Payer: Self-pay | Admitting: Physician Assistant

## 2022-12-10 DIAGNOSIS — R0981 Nasal congestion: Secondary | ICD-10-CM

## 2022-12-13 MED ORDER — FLUTICASONE PROPIONATE 50 MCG/ACT NA SUSP
2.0000 | Freq: Every day | NASAL | 2 refills | Status: DC
Start: 2022-12-13 — End: 2023-11-15

## 2022-12-27 ENCOUNTER — Ambulatory Visit (HOSPITAL_BASED_OUTPATIENT_CLINIC_OR_DEPARTMENT_OTHER): Payer: BC Managed Care – PPO | Admitting: Pulmonary Disease

## 2022-12-27 ENCOUNTER — Encounter (HOSPITAL_BASED_OUTPATIENT_CLINIC_OR_DEPARTMENT_OTHER): Payer: Self-pay | Admitting: Pulmonary Disease

## 2022-12-27 VITALS — BP 118/74 | HR 95 | Temp 98.2°F | Ht 73.0 in | Wt 168.2 lb

## 2022-12-27 DIAGNOSIS — J432 Centrilobular emphysema: Secondary | ICD-10-CM | POA: Diagnosis not present

## 2022-12-27 DIAGNOSIS — D869 Sarcoidosis, unspecified: Secondary | ICD-10-CM | POA: Diagnosis not present

## 2022-12-27 MED ORDER — TRELEGY ELLIPTA 100-62.5-25 MCG/ACT IN AEPB: 1.0000 | INHALATION_SPRAY | Freq: Every day | RESPIRATORY_TRACT | 0 refills | Status: DC

## 2022-12-27 MED ORDER — TRELEGY ELLIPTA 100-62.5-25 MCG/ACT IN AEPB: 1.0000 | INHALATION_SPRAY | Freq: Every day | RESPIRATORY_TRACT | 5 refills | Status: DC

## 2022-12-27 MED ORDER — PREDNISONE 10 MG PO TABS: ORAL_TABLET | ORAL | 0 refills | Status: AC

## 2022-12-27 NOTE — Progress Notes (Signed)
Subjective:   PATIENT ID: Jason [name] DOB: July 02, 1966, MRN: [id number]  Chief Complaint  Patient presents with   Follow-up    Follow up. Patient has no complaints.     Reason for Visit: New patient, sarcoid evaluation  Jason [name] is a 57 year old [name] active smoker with pulmonary sarcoidosis, COPD, ADHD, GERD, HLD who presents as a new patient for management of sarcoid. Wife presents and provides additional history.  After CT lung screening demonstrated RLL nodule that was hypermetabolic on PET, he underwent EBUS with sampling of station 4R and 7. Results with granulomatous inflammation suggestive of asthma.  He reports extreme fatigue for at least a year. 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 Feels wiped out and not himself. He has needed to sleep in longer. He reports shortness of breath, cough and wheezing that worsened after covid in October 2023. No limitation in activity but is tired from it. He reports nocturnal awakenings three times due to productive cough. Has lower bilaterally chest pain that is noticeable with coughing. He continues to smoke 1 ppd.   No reported fever, weight loss, visual disturbance,palpitations, abdominal pain, numbness, tingling, lightheadedness, syncope   Social History: Landlord, property caretaker. Physically demanding job  I have personally reviewed patient's past medical/family/social history, allergies, current medications.  Past Medical History:  Diagnosis Date   ADHD (attention deficit hyperactivity disorder)    Anxiety    BPH (benign prostatic hyperplasia) 10/15/2014   COPD (chronic obstructive pulmonary disease) (HCC)    Depression    GERD (gastroesophageal reflux disease)    Headache    Hyperlipidemia 10/16/2014   AHA 10 year risk of 2.8% March 2016      Family History  Problem Relation Age of Onset   Bipolar disorder Father    Heart attack Father 22        pacemaker, smoker   Alcohol abuse Paternal Uncle    Hypertension Unknown    Alcohol abuse Unknown      Social History   Occupational History   Occupation: Radio broadcast assistant.     Comment: [employer]  Tobacco Use   Smoking status: Every Day    Packs/day: 1.00    Years: 37.00    Additional pack years: 0.00    Total pack years: 37.00    Types: Cigarettes   Smokeless tobacco: Never   Tobacco comments:    Pt states he is smoking about 1 ppd. ALS 10/21/22  Vaping Use   Vaping Use: Never used  Substance and Sexual Activity   Alcohol use: Yes    Alcohol/week: 0.0 standard drinks of alcohol    Comment: drinks maybe once a month.     Drug use: No   Sexual activity: Yes    Allergies  Allergen Reactions   Octacosanol     Sinus and head pressure     Outpatient Medications Prior to Visit  Medication Sig Dispense Refill   acetaminophen (TYLENOL) 500 MG tablet Take 500 mg by mouth every 6 (six) hours as needed for moderate pain.     ARTIFICIAL TEAR SOLUTION OP Place 1 drop into both eyes daily as needed (irritation).     B COMPLEX VITAMINS SL Place 1 Dose under the tongue 3 (three) times a week.     famotidine (PEPCID) 40 MG tablet Take 40 mg by mouth daily.     fluticasone (FLONASE) 50 MCG/ACT nasal spray Place 2 sprays into  both nostrils daily. 16 g 2   loratadine (CLARITIN) 10 MG tablet Take 10 mg by mouth daily as needed for allergies.     meloxicam (MOBIC) 15 MG tablet TAKE 1 TABLET BY MOUTH ONCE DAILY 90 tablet 3   rizatriptan (MAXALT) 10 MG tablet Take 10 mg by mouth as needed for migraine.     triamcinolone cream (KENALOG) 0.1 % Apply 1 Application topically 2 (two) times daily as needed (eczema).     venlafaxine XR (EFFEXOR XR) 75 MG 24 hr capsule Take 1 capsule (75 mg total) by mouth daily with breakfast. 90 capsule 3   atorvastatin (LIPITOR) 20 MG tablet Take 1 tablet (20 mg total) by mouth at bedtime. (Patient not taking: Reported on 12/27/2022) 90 tablet 3   baclofen  (LIORESAL) 10 MG tablet Take 1 tablet (10 mg total) by mouth at bedtime as needed for muscle spasms. (Patient not taking: Reported on 12/27/2022) 30 each 0   buPROPion ER (WELLBUTRIN SR) 100 MG 12 hr tablet Take 1 tablet (100 mg total) by mouth 2 (two) times daily. (Patient not taking: Reported on 12/27/2022) 60 tablet 2   No facility-administered medications prior to visit.    Review of Systems  Constitutional:  Negative for chills, diaphoresis, fever, malaise/fatigue and weight loss.  HENT:  Positive for congestion.   Respiratory:  Positive for cough, sputum production, shortness of breath and wheezing. Negative for hemoptysis.   Cardiovascular:  Negative for chest pain, palpitations and leg swelling.     Objective:   Vitals:   12/27/22 1506  BP: 118/74  Pulse: 95  Temp: 98.2 F (36.8 C)  TempSrc: Oral  SpO2: 98%  Weight: 168 lb 3.2 oz (76.3 kg)  Height: 6\' 1"  (1.854 m)   SpO2: 98 % O2 Device: None (Room air)  Physical Exam: General: Well-appearing, no acute distress HENT: [place], AT Eyes: EOMI, no scleral icterus Respiratory: Clear to auscultation bilaterally.  No crackles, wheezing or rales Cardiovascular: RRR, -M/R/G, no JVD Extremities:-Edema,-tenderness Neuro: AAO x4, CNII-XII grossly intact Psych: Normal mood, normal affect  Data Reviewed:  Imaging: CT Lung Screen 09/29/22 - Nodular parenchymal retraction in the superior segment of right lower lobe PET 10/13/22 - Bilateral hypermetabolic mediastinal and hilar adenopathy  PFT: None on file  Labs:    Latest Ref Rng & Units 08/17/2022    9:03 AM 04/29/2021    8:42 AM 09/11/2019   10:32 AM  CBC  WBC 3.8 - 10.8 Thousand/uL 5.1  8.8  5.3   Hemoglobin 13.2 - 17.1 g/dL 40.9  81.1  91.4   Hematocrit 38.5 - 50.0 % 48.8  49.7  50.0   Platelets 140 - 400 Thousand/uL 167  166  146       Latest Ref Rng & Units 08/17/2022    9:03 AM 04/29/2021    8:42 AM 09/11/2019   10:32 AM  CMP  Glucose 65 - 99 mg/dL 87  782  93    BUN 7 - 25 mg/dL 15  12  16    Creatinine 0.70 - 1.30 mg/dL 9.56  2.13  0.86   Sodium 135 - 146 mmol/L 141  139  139   Potassium 3.5 - 5.3 mmol/L 5.6  4.0  4.3   Chloride 98 - 110 mmol/L 106  106  106   CO2 20 - 32 mmol/L 30  25  26    Calcium 8.6 - 10.3 mg/dL 9.4  9.0  9.4   Total Protein 6.1 - 8.1 g/dL 6.3  6.3  6.3   Total Bilirubin 0.2 - 1.2 mg/dL 0.5  0.6  0.7   AST 10 - 35 U/L 17  16  17    ALT 9 - 46 U/L 16  13  19     ACE 36  Bronchoscopy 11/01/22 FINAL MICROSCOPIC DIAGNOSIS:   A. LYMPH NODE, 11R, FINE NEEDLE ASPIRATION:  - No malignant cells identified  - Granulomatous inflammation   B. LYMPH NODE, 7, FINE NEEDLE ASPIRATION:  - No malignant cells identified  - Granulomatous inflammation     Assessment & Plan:   Discussion: 57 year old [name] active smoker with pulmonary sarcoidosis, COPD, ADHD, GERD, HLD who presents as a new patient for management of sarcoid.  We discussed the clinical course of sarcoid and management including serial PFTs, labs, eye exam, and EKG and chest imaging if indicated. If symptoms suggest sarcoid flare in the future, we would manage with steroids +/- biologics.  Adverse effects of steroids discussed including bruising, weight gain, fluid retention, Cushingoid features, hypertension, cardiac arrhythmias, osteoporosis, gastric ulcers, increased risk of infection, insomnia and hyperglycemia.  Reviewed risks and benefits of methotrexate therapy. Possible adverse effects including but not limited to increased risk of infection, liver toxicity, bone marrow suppression, pneumonitis and oral ulcers.  After discussion will pursue steroid trial. Prednisone 40 mg. Decrease by 10 mg every month.  Pulmonary sarcoidosis - active flare --Dx in 11/01/22 via endobronchial bx --PET/CT reviewed. Will need repeat in the future when immunosuppressant treatment plan stable  History of immunosuppression High risk medication management --Reviewed CBC and CMET   --Prednisone 40 mg. Decrease by 10 mg every month. --Recommend pepcid or PPI daily when taking steroids  Sarcoid Monitoring --Recent chest imaging reviewed.  --Annual PFTs.  Will order --Annual ophthalmology exam.  Next visit this year --EKG at next visit --Routine labs as needed: CBC with diff, CMET, 1, 25 and 25 hydroxy vitamin D, urinary calcium  Emphysema --START Trelegy 100 ONE puff ONCE a day --CONTINUE Albuterol AS NEEDED for shortness of breath or wheezing  Health Maintenance Immunization History  Administered Date(s) Administered   Moderna SARS-COV2 Booster Vaccination 07/07/2020   Moderna Sars-Covid-2 Vaccination 10/10/2019, 11/07/2019   Tdap 11/08/2011, 08/17/2022   CT Lung Screen - enrolled  Orders Placed This Encounter  Procedures   Pulmonary function test    Standing Status:   Future    Standing Expiration Date:   12/27/2023    Order Specific Question:   Where should this test be performed?    Answer:   [facility]    Order Specific Question:   Full PFT: includes the following: basic spirometry, spirometry pre & post bronchodilator, diffusion capacity (DLCO), lung volumes    Answer:   Full PFT   Meds ordered this encounter  Medications   predniSONE (DELTASONE) 10 MG tablet    Sig: Take 4 tablets (40 mg total) by mouth daily with breakfast for 28 days, THEN 3 tablets (30 mg total) daily with breakfast for 28 days, THEN 2 tablets (20 mg total) daily with breakfast for 28 days.    Dispense:  252 tablet    Refill:  0   Fluticasone-Umeclidin-Vilant (TRELEGY ELLIPTA) 100-62.5-25 MCG/ACT AEPB    Sig: Inhale 1 puff into the lungs daily.    Dispense:  60 each    Refill:  5   Fluticasone-Umeclidin-Vilant (TRELEGY ELLIPTA) 100-62.5-25 MCG/ACT AEPB    Sig: Inhale 1 puff into the lungs daily.    Dispense:  60 each  Refill:  0    Order Specific Question:   Lot Number?    Answer:   [id number]    Order Specific Question:   Expiration Date?    Answer:   04/17/2024     Order Specific Question:   Manufacturer?    Answer:   [employer] [[id number]]    Return in about 10 weeks (around 03/07/2023) for after PFT and to discuss sarcoid management.  I have spent a total time of 45-minutes on the day of the appointment reviewing prior documentation, coordinating care and discussing medical diagnosis and plan with the patient/family. Imaging, labs and tests included in this note have been reviewed and interpreted independently by me.  Shaul Trautman Mechele Collin, MD [facility] 12/27/2022 3:07 PM  Office Number [phone]

## 2022-12-27 NOTE — Patient Instructions (Addendum)
Pulmonary sarcoidosis --Dx in 11/01/22 via endobronchial bx --PET/CT reviewed --ORDER pulmonary function tests  History of immunosuppression High risk medication management --Reviewed CBC and CMET  --Prednisone 40 mg. Decrease by 10 mg every month. --Recommend pepcid or PPI daily when taking steroids  Emphysema --START Trelegy 100 ONE puff ONCE a day --CONTINUE Albuterol AS NEEDED for shortness of breath or wheezing

## 2023-03-07 ENCOUNTER — Encounter (HOSPITAL_BASED_OUTPATIENT_CLINIC_OR_DEPARTMENT_OTHER): Payer: Self-pay

## 2023-03-10 ENCOUNTER — Ambulatory Visit: Payer: BC Managed Care – PPO | Admitting: Pulmonary Disease

## 2023-03-10 DIAGNOSIS — J432 Centrilobular emphysema: Secondary | ICD-10-CM

## 2023-03-10 LAB — PULMONARY FUNCTION TEST
DL/VA % pred: 83 %
DL/VA: 3.54 ml/min/mmHg/L
DLCO cor % pred: 67 %
DLCO cor: 20.31 ml/min/mmHg
DLCO unc % pred: 67 %
DLCO unc: 20.31 ml/min/mmHg
FEF 25-75 Post: 2.37 L/s
FEF 25-75 Pre: 1.64 L/s
FEF2575-%Change-Post: 44 %
FEF2575-%Pred-Post: 71 %
FEF2575-%Pred-Pre: 49 %
FEV1-%Change-Post: 9 %
FEV1-%Pred-Post: 74 %
FEV1-%Pred-Pre: 68 %
FEV1-Post: 2.97 L
FEV1-Pre: 2.7 L
FEV1FVC-%Change-Post: 2 %
FEV1FVC-%Pred-Pre: 90 %
FEV6-%Change-Post: 7 %
FEV6-%Pred-Post: 83 %
FEV6-%Pred-Pre: 77 %
FEV6-Post: 4.15 L
FEV6-Pre: 3.86 L
FEV6FVC-%Change-Post: 0 %
FEV6FVC-%Pred-Post: 102 %
FEV6FVC-%Pred-Pre: 102 %
FVC-%Change-Post: 7 %
FVC-%Pred-Post: 81 %
FVC-%Pred-Pre: 75 %
FVC-Post: 4.22 L
FVC-Pre: 3.93 L
Post FEV1/FVC ratio: 70 %
Post FEV6/FVC ratio: 98 %
Pre FEV1/FVC ratio: 69 %
Pre FEV6/FVC Ratio: 98 %
RV % pred: 91 %
RV: 2.09 L
TLC % pred: 81 %
TLC: 6.05 L

## 2023-03-10 NOTE — Progress Notes (Signed)
PFT done today. 

## 2023-03-14 ENCOUNTER — Encounter (HOSPITAL_BASED_OUTPATIENT_CLINIC_OR_DEPARTMENT_OTHER): Payer: Self-pay | Admitting: Pulmonary Disease

## 2023-03-14 ENCOUNTER — Encounter (HOSPITAL_BASED_OUTPATIENT_CLINIC_OR_DEPARTMENT_OTHER): Payer: BC Managed Care – PPO

## 2023-03-14 ENCOUNTER — Ambulatory Visit (HOSPITAL_BASED_OUTPATIENT_CLINIC_OR_DEPARTMENT_OTHER): Payer: BC Managed Care – PPO | Admitting: Pulmonary Disease

## 2023-03-14 VITALS — BP 124/74 | HR 87 | Resp 16 | Ht 73.0 in | Wt 166.8 lb

## 2023-03-14 DIAGNOSIS — D869 Sarcoidosis, unspecified: Secondary | ICD-10-CM

## 2023-03-14 MED ORDER — PREDNISONE 10 MG PO TABS
10.0000 mg | ORAL_TABLET | Freq: Every day | ORAL | 2 refills | Status: DC
Start: 2023-03-14 — End: 2023-06-21

## 2023-03-14 NOTE — Patient Instructions (Addendum)
Pulmonary sarcoidosis - active flare High risk medication management --Prednisone 40 mg taper started in June.Will reduce to prednisone 10 mg daily  Emphysema --RESTART Trelegy 100 ONE puff ONCE a day --CONTINUE Albuterol AS NEEDED for shortness of breath or wheezing

## 2023-03-14 NOTE — Progress Notes (Signed)
Subjective:   PATIENT ID: Jason Bouche GENDER: male DOB: [birth year], MRN: [id number]  Chief Complaint  Patient presents with   Follow-up    PFT - states he is doing fine    Reason for Visit: Follow-up sarcoid evaluation  Jason [name] is a 57 year old male active smoker with pulmonary sarcoidosis, COPD, ADHD, GERD, HLD who presents for management of sarcoid.   Initial consult After CT lung screening demonstrated RLL nodule that was hypermetabolic on PET, he underwent EBUS with sampling of station 4R and 7. Results with granulomatous inflammation suggestive of asthma.  He reports extreme fatigue for at least a year. 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 Feels wiped out and not himself. He has needed to sleep in longer. He reports shortness of breath, cough and wheezing that worsened after covid in October 2023. No limitation in activity but is tired from it. He reports nocturnal awakenings three times due to productive cough. Has lower bilaterally chest pain that is noticeable with coughing. He continues to smoke 1 ppd.   No reported fever, weight loss, visual disturbance,palpitations, abdominal pain, numbness, tingling, lightheadedness, syncope    03/14/23 Since our last visit he was started on prednisone taper and 20 mg taper. Has ran out of steroids one week ago. Cough and energy has improved on steroids and noticed cough and wheezing recurred in the last week. May be due to allergies as well. Has reduced smoking 5-10 cig. Wife present on phone.  Social History: Landlord, property caretaker. Physically demanding job   Past Medical History:  Diagnosis Date   ADHD (attention deficit hyperactivity disorder)    Anxiety    BPH (benign prostatic hyperplasia) 10/15/2014   COPD (chronic obstructive pulmonary disease) (HCC)    Depression    GERD (gastroesophageal reflux disease)    Headache    Hyperlipidemia 10/16/2014    AHA 10 year risk of 2.8% March 2016      Family History  Problem Relation Age of Onset   Bipolar disorder Father    Heart attack Father 8       pacemaker, smoker   Alcohol abuse Paternal Uncle    Hypertension Unknown    Alcohol abuse Unknown      Social History   Occupational History   Occupation: Radio broadcast assistant.     Comment: [employer]  Tobacco Use   Smoking status: Every Day    Current packs/day: 1.00    Average packs/day: 1 pack/day for 37.0 years (37.0 ttl pk-yrs)    Types: Cigarettes   Smokeless tobacco: Never   Tobacco comments:    Pt states he is smoking about 1 ppd. ALS 10/21/22  Vaping Use   Vaping status: Never Used  Substance and Sexual Activity   Alcohol use: Yes    Alcohol/week: 0.0 standard drinks of alcohol    Comment: drinks maybe once a month.     Drug use: No   Sexual activity: Yes    Allergies  Allergen Reactions   Octacosanol     Sinus and head pressure     Outpatient Medications Prior to Visit  Medication Sig Dispense Refill   acetaminophen (TYLENOL) 500 MG tablet Take 500 mg by mouth every 6 (six) hours as needed for moderate pain.     ARTIFICIAL TEAR SOLUTION OP Place 1 drop into both eyes daily as needed (irritation).     B COMPLEX VITAMINS SL Place 1 Dose under the tongue  3 (three) times a week.     baclofen (LIORESAL) 10 MG tablet Take 1 tablet (10 mg total) by mouth at bedtime as needed for muscle spasms. 30 each 0   famotidine (PEPCID) 40 MG tablet Take 40 mg by mouth daily.     fluticasone (FLONASE) 50 MCG/ACT nasal spray Place 2 sprays into both nostrils daily. 16 [name] 2   Fluticasone-Umeclidin-Vilant (TRELEGY ELLIPTA) 100-62.5-25 MCG/ACT AEPB Inhale 1 puff into the lungs daily. 60 each 5   Fluticasone-Umeclidin-Vilant (TRELEGY ELLIPTA) 100-62.5-25 MCG/ACT AEPB Inhale 1 puff into the lungs daily. 60 each 0   loratadine (CLARITIN) 10 MG tablet Take 10 mg by mouth daily as needed for allergies.     meloxicam (MOBIC) 15 MG tablet TAKE 1  TABLET BY MOUTH ONCE DAILY 90 tablet 3   predniSONE (DELTASONE) 10 MG tablet Take 4 tablets (40 mg total) by mouth daily with breakfast for 28 days, THEN 3 tablets (30 mg total) daily with breakfast for 28 days, THEN 2 tablets (20 mg total) daily with breakfast for 28 days. 252 tablet 0   rizatriptan (MAXALT) 10 MG tablet Take 10 mg by mouth as needed for migraine.     triamcinolone cream (KENALOG) 0.1 % Apply 1 Application topically 2 (two) times daily as needed (eczema).     venlafaxine XR (EFFEXOR XR) 75 MG 24 hr capsule Take 1 capsule (75 mg total) by mouth daily with breakfast. 90 capsule 3   atorvastatin (LIPITOR) 20 MG tablet Take 1 tablet (20 mg total) by mouth at bedtime. (Patient not taking: Reported on 12/27/2022) 90 tablet 3   buPROPion ER (WELLBUTRIN SR) 100 MG 12 hr tablet Take 1 tablet (100 mg total) by mouth 2 (two) times daily. (Patient not taking: Reported on 12/27/2022) 60 tablet 2   No facility-administered medications prior to visit.    Review of Systems  Constitutional:  Negative for chills, diaphoresis, fever, malaise/fatigue and weight loss.  HENT:  Negative for congestion.   Respiratory:  Positive for cough, shortness of breath and wheezing. Negative for hemoptysis and sputum production.   Cardiovascular:  Negative for chest pain, palpitations and leg swelling.     Objective:   Vitals:   03/14/23 1558  BP: 124/74  Pulse: 87  Resp: 16  SpO2: 98%  Weight: 166 lb 12.8 oz (75.7 kg)  Height: 6\' 1"  (1.854 m)   SpO2: 98 %  Physical Exam: General: Well-appearing, no acute distress HENT: [place], AT Eyes: EOMI, no scleral icterus Respiratory: Clear to auscultation bilaterally.  No crackles, wheezing or rales Cardiovascular: RRR, -M/R/[name], no JVD Extremities:-Edema,-tenderness Neuro: AAO x4, CNII-XII grossly intact Psych: Normal mood, normal affect  Data Reviewed:  Imaging: CT Lung Screen 09/29/22 - Nodular parenchymal retraction in the superior segment of right  lower lobe PET 10/13/22 - Bilateral hypermetabolic mediastinal and hilar adenopathy  PFT: 03/10/23 FVC 4.22 (81%) FEV1 2.97 (74%) Ratio 69  TLC 81% DLCO 67% Interpretation: No obstructive or restrictive defect. Though values are borderline for obstruction. Mildly reduced DLCO   Labs:    Latest Ref Rng & Units 08/17/2022    9:03 AM 04/29/2021    8:42 AM 09/11/2019   10:32 AM  CBC  WBC 3.8 - 10.8 Thousand/uL 5.1  8.8  5.3   Hemoglobin 13.2 - 17.1 [name]/dL 29.5  62.1  30.8   Hematocrit 38.5 - 50.0 % 48.8  49.7  50.0   Platelets 140 - 400 Thousand/uL 167  166  146  Latest Ref Rng & Units 08/17/2022    9:03 AM 04/29/2021    8:42 AM 09/11/2019   10:32 AM  CMP  Glucose 65 - 99 mg/dL 87  782  93   BUN 7 - 25 mg/dL 15  12  16    Creatinine 0.70 - 1.30 mg/dL 9.56  2.13  0.86   Sodium 135 - 146 mmol/L 141  139  139   Potassium 3.5 - 5.3 mmol/L 5.6  4.0  4.3   Chloride 98 - 110 mmol/L 106  106  106   CO2 20 - 32 mmol/L 30  25  26    Calcium 8.6 - 10.3 mg/dL 9.4  9.0  9.4   Total Protein 6.1 - 8.1 [name]/dL 6.3  6.3  6.3   Total Bilirubin 0.2 - 1.2 mg/dL 0.5  0.6  0.7   AST 10 - 35 U/L 17  16  17    ALT 9 - 46 U/L 16  13  19     ACE 36  Bronchoscopy 11/01/22 FINAL MICROSCOPIC DIAGNOSIS:   A. LYMPH NODE, 11R, FINE NEEDLE ASPIRATION:  - No malignant cells identified  - Granulomatous inflammation   B. LYMPH NODE, 7, FINE NEEDLE ASPIRATION:  - No malignant cells identified  - Granulomatous inflammation     Assessment & Plan:   Discussion: 57 year old male active smoker with pulmonary sarcoidosis, COPD, ADHD, GERD, HLD who presents for management of sarcoid.  We discussed the clinical course of sarcoid and management including serial PFTs, labs, eye exam, and EKG and chest imaging if indicated. If symptoms suggest sarcoid flare in the future, we would manage with steroids +/- biologics.  Adverse effects of steroids discussed including bruising, weight gain, fluid retention, Cushingoid  features, hypertension, cardiac arrhythmias, osteoporosis, gastric ulcers, increased risk of infection, insomnia and hyperglycemia.  Reviewed risks and benefits of methotrexate therapy. Possible adverse effects including but not limited to increased risk of infection, liver toxicity, bone marrow suppression, pneumonitis and oral ulcers.  Currently on prednisone trial with improvement in energy and respiratory symptoms. Has recurred off steroids. Will decrease to 10 mg daily  Pulmonary sarcoidosis - active flare --Dx in 11/01/22 via endobronchial bx --PET/CT reviewed. Will need repeat in the future when immunosuppressant treatment plan stable  History of immunosuppression High risk medication management --Reviewed CBC and CMET  --Prednisone 40 mg taper started in June.Will reduce to prednisone 10 mg daily  >Advised to contact sooner if not well controlled on 10 mg. May need 20 mg daily --Recommend pepcid or PPI daily when taking steroids  Sarcoid Monitoring --Recent chest imaging reviewed.  --Annual PFTs.  Will order --Annual ophthalmology exam.  Next visit this year --EKG at next visit --Routine labs as needed: CBC with diff, CMET, 1, 25 and 25 hydroxy vitamin D, urinary calcium  Emphysema --RESTART Trelegy 100 ONE puff ONCE a day --CONTINUE Albuterol AS NEEDED for shortness of breath or wheezing  Health Maintenance Immunization History  Administered Date(s) Administered   Moderna SARS-COV2 Booster Vaccination 07/07/2020   Moderna Sars-Covid-2 Vaccination 10/10/2019, 11/07/2019   Tdap 11/08/2011, 08/17/2022   CT Lung Screen - enrolled  No orders of the defined types were placed in this encounter.  Meds ordered this encounter  Medications   predniSONE (DELTASONE) 10 MG tablet    Sig: Take 1 tablet (10 mg total) by mouth daily with breakfast.    Dispense:  30 tablet    Refill:  2    Return in about 2 months (around  05/14/2023) for My chart video visit in October with  me.  I have spent a total time of 30-minutes on the day of the appointment including chart review, data review, collecting history, coordinating care and discussing medical diagnosis and plan with the patient/family. Past medical history, allergies, medications were reviewed. Pertinent imaging, labs and tests included in this note have been reviewed and interpreted independently by me.   Cordelia Bessinger Mechele Collin, MD [facility] 03/14/2023 4:31 PM  Office Number [phone]

## 2023-04-17 ENCOUNTER — Encounter: Payer: Self-pay | Admitting: Physician Assistant

## 2023-04-17 ENCOUNTER — Ambulatory Visit: Payer: BC Managed Care – PPO

## 2023-04-17 DIAGNOSIS — Z87891 Personal history of nicotine dependence: Secondary | ICD-10-CM

## 2023-04-17 DIAGNOSIS — R911 Solitary pulmonary nodule: Secondary | ICD-10-CM | POA: Diagnosis not present

## 2023-04-17 DIAGNOSIS — I700 Atherosclerosis of aorta: Secondary | ICD-10-CM | POA: Diagnosis not present

## 2023-04-17 DIAGNOSIS — R918 Other nonspecific abnormal finding of lung field: Secondary | ICD-10-CM | POA: Diagnosis not present

## 2023-04-17 DIAGNOSIS — J432 Centrilobular emphysema: Secondary | ICD-10-CM | POA: Diagnosis not present

## 2023-04-17 MED ORDER — FAMOTIDINE 40 MG PO TABS: 40.0000 mg | ORAL_TABLET | Freq: Every day | ORAL | 1 refills | Status: DC

## 2023-04-17 NOTE — Telephone Encounter (Signed)
Requesting rx rf of Famotidine Last written by historical provider Last OV 08/17/2022 No  upcoming appt schld

## 2023-04-26 ENCOUNTER — Telehealth: Payer: Self-pay | Admitting: Acute Care

## 2023-04-26 DIAGNOSIS — R911 Solitary pulmonary nodule: Secondary | ICD-10-CM

## 2023-04-26 NOTE — Telephone Encounter (Signed)
I have called the patient with the results of his low-dose screening CT.  His scan was read as a lung RADS 3, probably benign.  This was a follow-up of a lung RADS 4B that was done March 2024.  Nodules appear to be stable per radiology recommend 59-month follow-up low-dose CT. Plan will be for a 42-month follow-up low-dose CT which will be due in March 2025.  Patient is in agreement with this plan. Please fax results to PCP and let them know plan is for a 48-month follow-up low-dose screening CT that will be due in March 2025. Please place order for 93-month follow-up screening CT. Thank you so much

## 2023-04-26 NOTE — Telephone Encounter (Signed)
Results and plan sent to PCP. 6 month follow up CT order placed to be due in March 2025.

## 2023-05-24 DIAGNOSIS — H2513 Age-related nuclear cataract, bilateral: Secondary | ICD-10-CM | POA: Diagnosis not present

## 2023-06-16 ENCOUNTER — Telehealth (HOSPITAL_BASED_OUTPATIENT_CLINIC_OR_DEPARTMENT_OTHER): Payer: BC Managed Care – PPO | Admitting: Pulmonary Disease

## 2023-06-18 ENCOUNTER — Other Ambulatory Visit (HOSPITAL_BASED_OUTPATIENT_CLINIC_OR_DEPARTMENT_OTHER): Payer: Self-pay | Admitting: Pulmonary Disease

## 2023-06-22 ENCOUNTER — Telehealth (HOSPITAL_BASED_OUTPATIENT_CLINIC_OR_DEPARTMENT_OTHER): Payer: BC Managed Care – PPO | Admitting: Pulmonary Disease

## 2023-06-22 ENCOUNTER — Encounter (HOSPITAL_BASED_OUTPATIENT_CLINIC_OR_DEPARTMENT_OTHER): Payer: Self-pay | Admitting: Pulmonary Disease

## 2023-06-22 DIAGNOSIS — Z79899 Other long term (current) drug therapy: Secondary | ICD-10-CM

## 2023-06-22 DIAGNOSIS — J432 Centrilobular emphysema: Secondary | ICD-10-CM

## 2023-06-22 DIAGNOSIS — Z862 Personal history of diseases of the blood and blood-forming organs and certain disorders involving the immune mechanism: Secondary | ICD-10-CM | POA: Diagnosis not present

## 2023-06-22 DIAGNOSIS — D869 Sarcoidosis, unspecified: Secondary | ICD-10-CM | POA: Diagnosis not present

## 2023-06-22 MED ORDER — TRELEGY ELLIPTA 100-62.5-25 MCG/ACT IN AEPB: 1.0000 | INHALATION_SPRAY | Freq: Every day | RESPIRATORY_TRACT | 11 refills | Status: DC

## 2023-06-22 NOTE — Progress Notes (Signed)
Virtual Visit via Video Note  I connected with Jason [name] on 06/22/23 at  8:30 AM EST by a video enabled telemedicine application and verified that I am speaking with the correct person using two identifiers.  Location: Patient: Home Provider: [facility]   I discussed the limitations of evaluation and management by telemedicine and the availability of in person appointments. The patient expressed understanding and agreed to proceed.   I discussed the assessment and treatment plan with the patient. The patient was provided an opportunity to ask questions and all were answered. The patient agreed with the plan and demonstrated an understanding of the instructions.   The patient was advised to call back or seek an in-person evaluation if the symptoms worsen or if the condition fails to improve as anticipated.  I provided 30 minutes of non-face-to-face time during this encounter.   Yareliz Thorstenson Mechele Collin, MD  Subjective:   PATIENT ID: Jason [name] GENDER: male DOB: 18-Jan-1966, MRN: [id number]  Chief Complaint  Patient presents with   Follow-up    Video visit; follow-up sarcoid     Reason for Visit: Follow-up sarcoid evaluation  Mr. Jason [name] is a 57 year old male active smoker with pulmonary sarcoidosis, COPD, ADHD, GERD, HLD who presents for management of sarcoid.   Initial consult After CT lung screening demonstrated RLL nodule that was hypermetabolic on PET, he underwent EBUS with sampling of station 4R and 7. Results with granulomatous inflammation suggestive of asthma.  He reports extreme fatigue for at least a year. 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 Feels wiped out and not himself. He has needed to sleep in longer. He reports shortness of breath, cough and wheezing that worsened after covid in October 2023. No limitation in activity but is tired from it. He reports nocturnal awakenings three  times due to productive cough. Has lower bilaterally chest pain that is noticeable with coughing. He continues to smoke 1 ppd.   No reported fever, weight loss, visual disturbance,palpitations, abdominal pain, numbness, tingling, lightheadedness, syncope    03/14/23 Since our last visit he was started on prednisone taper and 20 mg taper. Has ran out of steroids one week ago. Cough and energy has improved on steroids and noticed cough and wheezing recurred in the last week. May be due to allergies as well. Has reduced smoking 5-10 cig. Wife present on phone.  06/22/23 He is currently on prednisone 10 mg. He uses Trelegy every other day. Uses albuterol when ill. Last illness in November for 2-3 weeks but did not request steroids or antibiotics. His breathing has overall improved but still some wheezing weekly.  Social History: Landlord, property caretaker. Physically demanding job   Past Medical History:  Diagnosis Date   ADHD (attention deficit hyperactivity disorder)    Anxiety    BPH (benign prostatic hyperplasia) 10/15/2014   COPD (chronic obstructive pulmonary disease) (HCC)    Depression    GERD (gastroesophageal reflux disease)    Headache    Hyperlipidemia 10/16/2014   AHA 10 year risk of 2.8% March 2016      Family History  Problem Relation Age of Onset   Bipolar disorder Father    Heart attack Father 16       pacemaker, smoker   Alcohol abuse Paternal Uncle    Hypertension Unknown    Alcohol abuse Unknown      Social History   Occupational History   Occupation: Energy manager  installer.     Comment: [employer]  Tobacco Use   Smoking status: Every Day    Current packs/day: 1.00    Average packs/day: 1 pack/day for 37.0 years (37.0 ttl pk-yrs)    Types: Cigarettes   Smokeless tobacco: Never   Tobacco comments:    Pt states he is smoking about 1 ppd. ALS 10/21/22  Vaping Use   Vaping status: Never Used  Substance and Sexual Activity   Alcohol use: Yes    Alcohol/week:  0.0 standard drinks of alcohol    Comment: drinks maybe once a month.     Drug use: No   Sexual activity: Yes    Allergies  Allergen Reactions   Octacosanol     Sinus and head pressure     Outpatient Medications Prior to Visit  Medication Sig Dispense Refill   acetaminophen (TYLENOL) 500 MG tablet Take 500 mg by mouth every 6 (six) hours as needed for moderate pain.     ARTIFICIAL TEAR SOLUTION OP Place 1 drop into both eyes daily as needed (irritation).     B COMPLEX VITAMINS SL Place 1 Dose under the tongue 3 (three) times a week.     famotidine (PEPCID) 40 MG tablet Take 1 tablet (40 mg total) by mouth daily. 90 tablet 1   fluticasone (FLONASE) 50 MCG/ACT nasal spray Place 2 sprays into both nostrils daily. 16 g 2   Fluticasone-Umeclidin-Vilant (TRELEGY ELLIPTA) 100-62.5-25 MCG/ACT AEPB Inhale 1 puff into the lungs daily. 60 each 0   meloxicam (MOBIC) 15 MG tablet TAKE 1 TABLET BY MOUTH ONCE DAILY 90 tablet 3   predniSONE (DELTASONE) 10 MG tablet Take 1 tablet by mouth once daily with breakfast 30 tablet 2   rizatriptan (MAXALT) 10 MG tablet Take 10 mg by mouth as needed for migraine.     triamcinolone cream (KENALOG) 0.1 % Apply 1 Application topically 2 (two) times daily as needed (eczema).     venlafaxine XR (EFFEXOR XR) 75 MG 24 hr capsule Take 1 capsule (75 mg total) by mouth daily with breakfast. 90 capsule 3   Fluticasone-Umeclidin-Vilant (TRELEGY ELLIPTA) 100-62.5-25 MCG/ACT AEPB Inhale 1 puff into the lungs daily. 60 each 5   loratadine (CLARITIN) 10 MG tablet Take 10 mg by mouth daily as needed for allergies.     atorvastatin (LIPITOR) 20 MG tablet Take 1 tablet (20 mg total) by mouth at bedtime. (Patient not taking: Reported on 12/27/2022) 90 tablet 3   baclofen (LIORESAL) 10 MG tablet Take 1 tablet (10 mg total) by mouth at bedtime as needed for muscle spasms. (Patient not taking: Reported on 06/22/2023) 30 each 0   buPROPion ER (WELLBUTRIN SR) 100 MG 12 hr tablet Take 1  tablet (100 mg total) by mouth 2 (two) times daily. (Patient not taking: Reported on 12/27/2022) 60 tablet 2   No facility-administered medications prior to visit.    Review of Systems  Constitutional:  Negative for chills, diaphoresis, fever, malaise/fatigue and weight loss.  HENT:  Negative for congestion.   Respiratory:  Positive for wheezing. Negative for cough, hemoptysis, sputum production and shortness of breath.   Cardiovascular:  Negative for chest pain, palpitations and leg swelling.     Objective:   There were no vitals filed for this visit.     Physical Exam: General: Well-appearing, no acute distress HENT: [place], AT Eyes: EOMI, no scleral icterus Respiratory: No respiratory distress Neuro: AAO x4, CNII-XII grossly intact Psych: Normal mood, normal affect   Data Reviewed:  Imaging: CT Lung Screen 09/29/22 - Nodular parenchymal retraction in the superior segment of right lower lobe PET 10/13/22 - Bilateral hypermetabolic mediastinal and hilar adenopathy CT Chest Lung Screen 04/17/23 - stable nodules with recommendations to repeat CT in 6 months.  PFT: 03/10/23 FVC 4.22 (81%) FEV1 2.97 (74%) Ratio 69  TLC 81% DLCO 67% Interpretation: No obstructive or restrictive defect. Though values are borderline for obstruction. Mildly reduced DLCO   Labs:    Latest Ref Rng & Units 08/17/2022    9:03 AM 04/29/2021    8:42 AM 09/11/2019   10:32 AM  CBC  WBC 3.8 - 10.8 Thousand/uL 5.1  8.8  5.3   Hemoglobin 13.2 - 17.1 g/dL 25.3  66.4  40.3   Hematocrit 38.5 - 50.0 % 48.8  49.7  50.0   Platelets 140 - 400 Thousand/uL 167  166  146       Latest Ref Rng & Units 08/17/2022    9:03 AM 04/29/2021    8:42 AM 09/11/2019   10:32 AM  CMP  Glucose 65 - 99 mg/dL 87  474  93   BUN 7 - 25 mg/dL 15  12  16    Creatinine 0.70 - 1.30 mg/dL 2.59  5.63  8.75   Sodium 135 - 146 mmol/L 141  139  139   Potassium 3.5 - 5.3 mmol/L 5.6  4.0  4.3   Chloride 98 - 110 mmol/L 106  106  106   CO2  20 - 32 mmol/L 30  25  26    Calcium 8.6 - 10.3 mg/dL 9.4  9.0  9.4   Total Protein 6.1 - 8.1 g/dL 6.3  6.3  6.3   Total Bilirubin 0.2 - 1.2 mg/dL 0.5  0.6  0.7   AST 10 - 35 U/L 17  16  17    ALT 9 - 46 U/L 16  13  19     ACE 36  Bronchoscopy 11/01/22 FINAL MICROSCOPIC DIAGNOSIS:   A. LYMPH NODE, 11R, FINE NEEDLE ASPIRATION:  - No malignant cells identified  - Granulomatous inflammation   B. LYMPH NODE, 7, FINE NEEDLE ASPIRATION:  - No malignant cells identified  - Granulomatous inflammation     Assessment & Plan:   Discussion: 57- year old male active smoker with pulmonary sarcoidosis, COPD, ADHD, GERD, HLD who presents for management of sarcoid.  We discussed the clinical course of sarcoid and management including serial PFTs, labs, eye exam, and EKG and chest imaging if indicated. If symptoms suggest sarcoid flare in the future, we would manage with steroids +/- biologics.  Currently on low dose prednisone with improved symptoms. Some breakthrough wheezing but not consistently taking bronchodilators.  Pulmonary sarcoidosis - active flare --Dx in 11/01/22 via endobronchial bx --PET/CT 09/2022 with hypermetabolic mediastinal and hilar adenopathy. Will need repeat in the future when immunosuppressant treatment plan stable. Potentially 09/2023  History of immunosuppression High risk medication management --Prednisone 40 mg taper started in June 2024. Currently on 10 mg daily  >06/22/23 Start 5 mg prednisone daily --Recommend pepcid or PPI daily when taking steroids  Sarcoid Monitoring --Recent chest imaging reviewed.  --Annual PFTs.  Due 02/2024 --Annual ophthalmology exam.  Next visit this year --Routine labs as needed: CBC with diff, CMET, 1, 25 and 25 hydroxy vitamin D, urinary calcium  Emphysema --Counseled on RESTARTING Trelegy 100 ONE puff ONCE a day. REFILLED --CONTINUE Albuterol AS NEEDED for shortness of breath or wheezing --Advised smoking cessation  Health  Maintenance Immunization History  Administered  Date(s) Administered   Moderna SARS-COV2 Booster Vaccination 07/07/2020   Moderna Sars-Covid-2 Vaccination 10/10/2019, 11/07/2019   Tdap 11/08/2011, 08/17/2022   CT Lung Screen - enrolled. March 2025  No orders of the defined types were placed in this encounter.  Meds ordered this encounter  Medications   Fluticasone-Umeclidin-Vilant (TRELEGY ELLIPTA) 100-62.5-25 MCG/ACT AEPB    Sig: Inhale 1 puff into the lungs daily.    Dispense:  60 each    Refill:  11    Return in about 2 months (around 08/23/2023) for end of Jan or early Feb 2025.  I have spent a total time of 30-minutes on the day of the appointment including chart review, data review, collecting history, coordinating care and discussing medical diagnosis and plan with the patient/family. Past medical history, allergies, medications were reviewed. Pertinent imaging, labs and tests included in this note have been reviewed and interpreted independently by me.  Chen Saadeh Mechele Collin, MD [facility] 06/22/2023 8:38 AM  Office Number [phone]

## 2023-06-22 NOTE — Patient Instructions (Addendum)
Sarcoid --Prednisone 40 mg taper started in June 2024. Currently on 10 mg daily  >06/22/23 Start 5 mg prednisone daily --PET/CT 09/2022 with hypermetabolic mediastinal and hilar adenopathy. Will need repeat in the future when immunosuppressant treatment plan stable. Potentially 09/2023  Emphysema --Counseled on RESTARTING Trelegy 100 ONE puff ONCE a day. REFILLED --CONTINUE Albuterol AS NEEDED for shortness of breath or wheezing --Advised smoking cessation

## 2023-08-20 DIAGNOSIS — H18891 Other specified disorders of cornea, right eye: Secondary | ICD-10-CM | POA: Diagnosis not present

## 2023-09-16 ENCOUNTER — Other Ambulatory Visit: Payer: Self-pay | Admitting: Physician Assistant

## 2023-09-16 DIAGNOSIS — G8929 Other chronic pain: Secondary | ICD-10-CM

## 2023-09-16 DIAGNOSIS — M5030 Other cervical disc degeneration, unspecified cervical region: Secondary | ICD-10-CM

## 2023-09-18 ENCOUNTER — Other Ambulatory Visit: Payer: Self-pay | Admitting: Physician Assistant

## 2023-09-18 DIAGNOSIS — M5030 Other cervical disc degeneration, unspecified cervical region: Secondary | ICD-10-CM

## 2023-09-18 DIAGNOSIS — G8929 Other chronic pain: Secondary | ICD-10-CM

## 2023-09-20 ENCOUNTER — Encounter: Payer: Self-pay | Admitting: Physician Assistant

## 2023-09-20 DIAGNOSIS — G8929 Other chronic pain: Secondary | ICD-10-CM

## 2023-09-20 DIAGNOSIS — M5030 Other cervical disc degeneration, unspecified cervical region: Secondary | ICD-10-CM

## 2023-09-20 MED ORDER — VENLAFAXINE HCL ER 75 MG PO CP24
75.0000 mg | ORAL_CAPSULE | Freq: Every day | ORAL | 4 refills | Status: DC
Start: 2023-09-20 — End: 2024-03-21

## 2023-10-06 IMAGING — DX DG CERVICAL SPINE COMPLETE 4+V
6 series · 6 of 6 positions shown · non-contrast
Comparison: None.

CLINICAL DATA: Chronic neck pain

EXAM:
CERVICAL SPINE - COMPLETE 4+ VIEW

[c-spine lat]
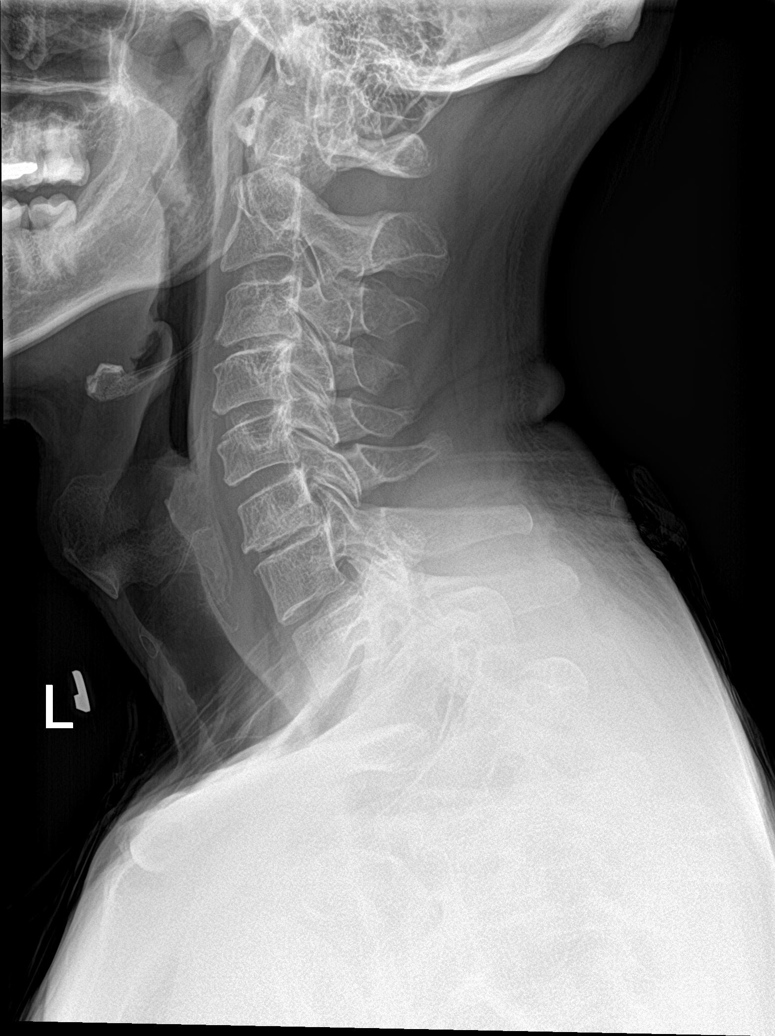

[c-spine obl (1 of 2)]
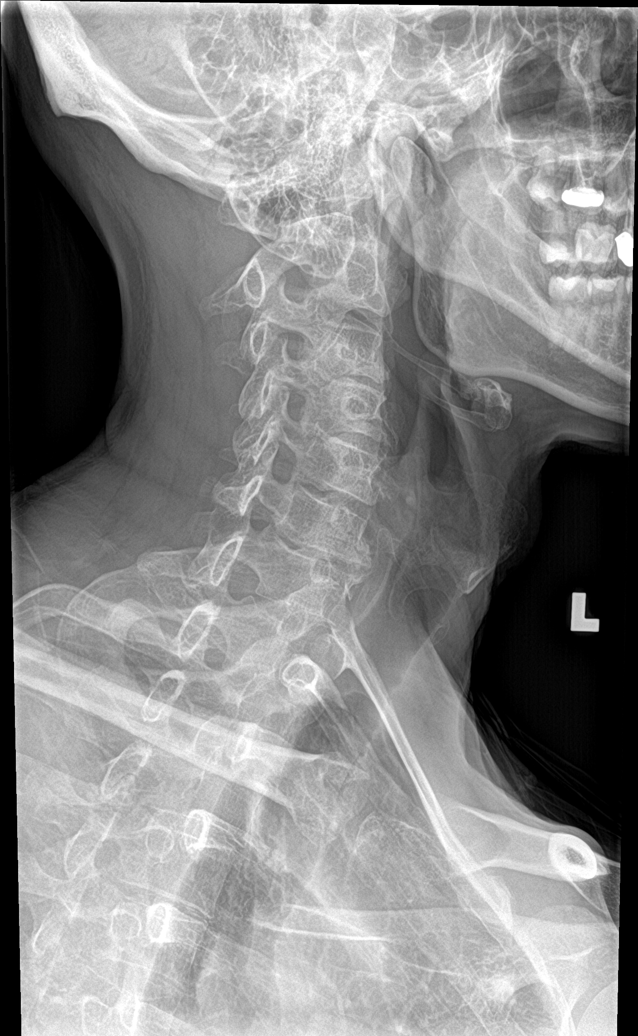

[c-spine obl (2 of 2)]
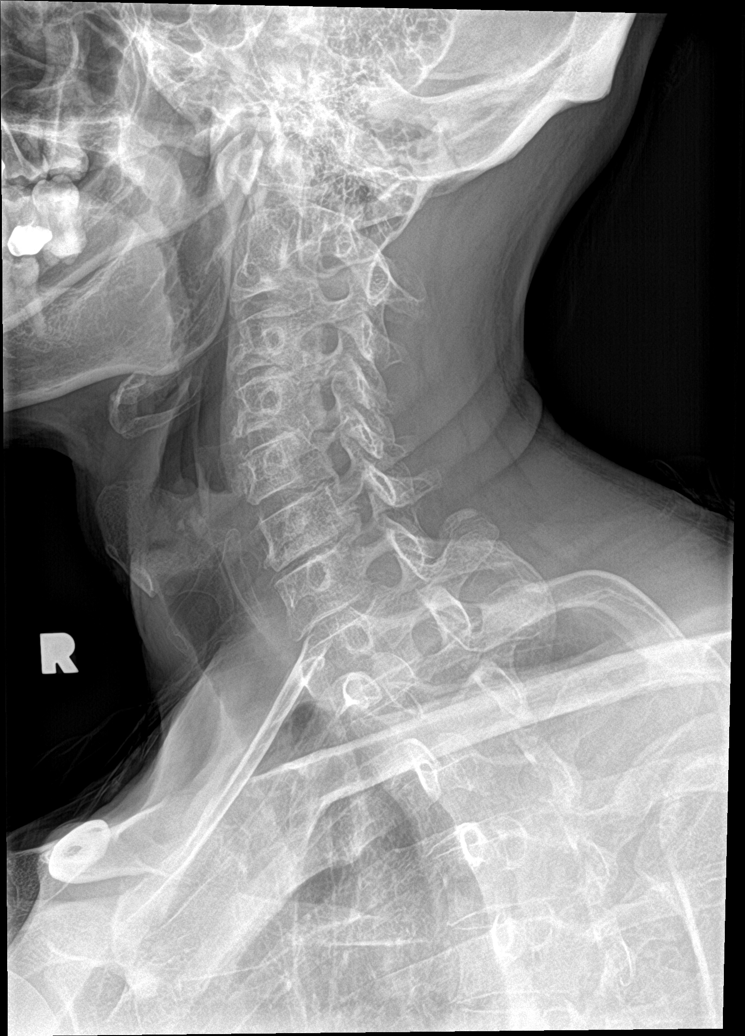

[c-spine ap]
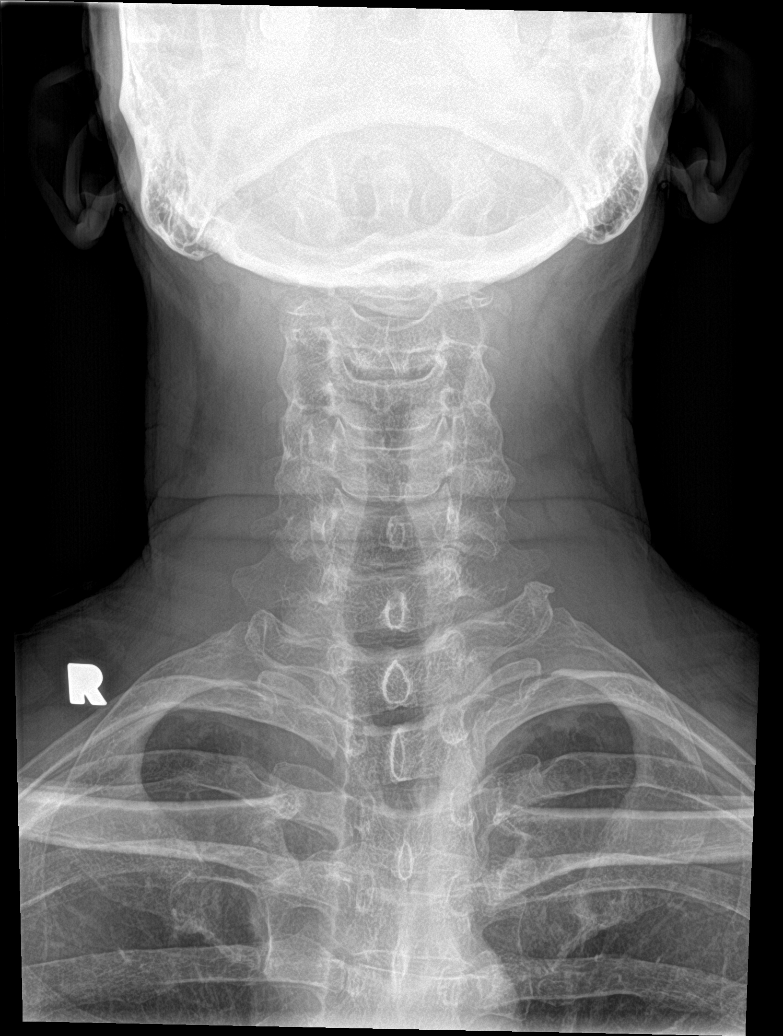

[c-spine open mouth]
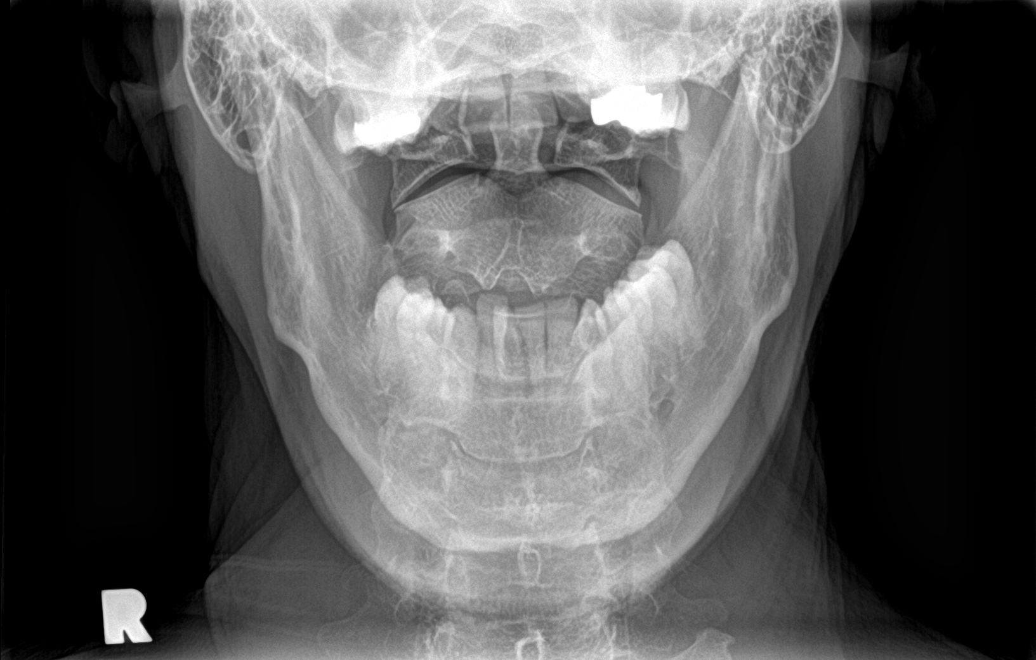

[c-spine swimmers]
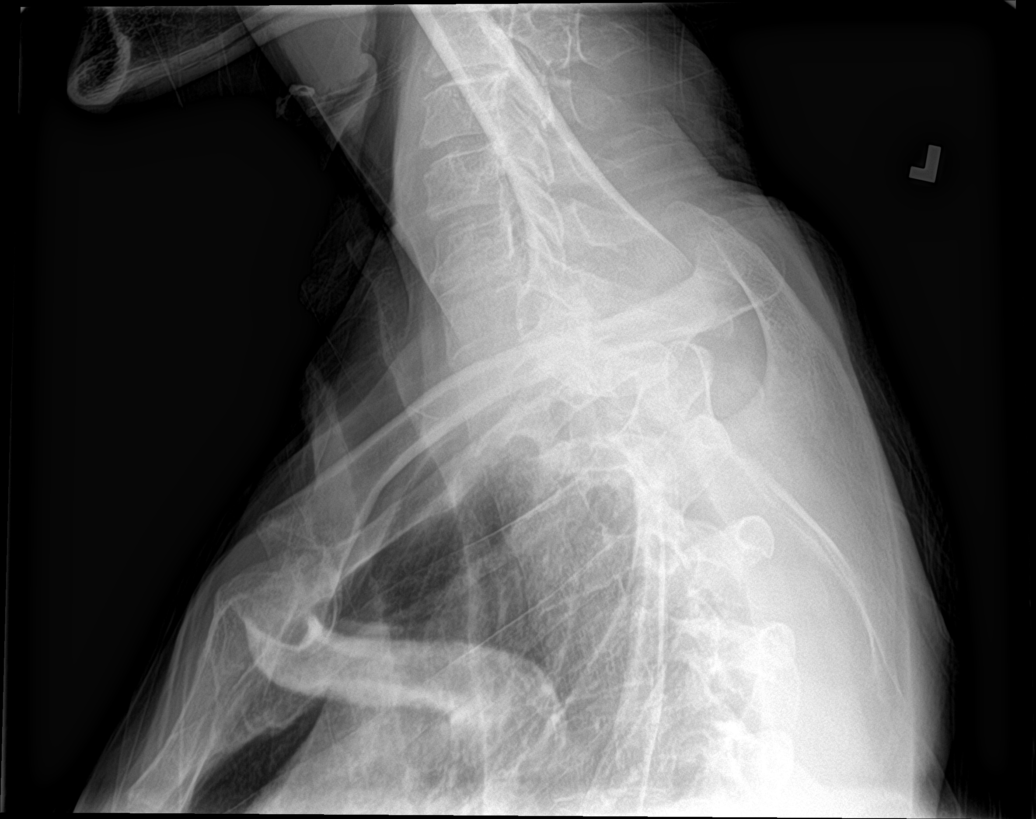

[6 of 6 positions shown; findings below may reference images not displayed]

FINDINGS: Normal cervical lordosis. No acute fracture or listhesis of the
cervical spine. Vertebral body height is preserved. There is
intervertebral disc space narrowing and endplate remodeling at C3-4
and C6-7 in keeping with changes of mild to moderate degenerative
disc disease, more severe at C6-7. The prevertebral soft tissues are
not thickened. The spinal canal is widely patent. Oblique images
demonstrates mild right neuroforaminal narrowing at C3-4, C4-5 and
C6-7 secondary to uncovertebral arthrosis. On the left, there is
mild neuroforaminal narrowing at C3-4, C4-5 and C6-7 secondary to
uncovertebral arthrosis.
IMPRESSION: Mild degenerative disc and degenerative joint disease resulting in
multilevel mild neuroforaminal narrowing as described above.

## 2023-10-16 ENCOUNTER — Encounter

## 2023-10-20 ENCOUNTER — Ambulatory Visit

## 2023-10-20 DIAGNOSIS — J432 Centrilobular emphysema: Secondary | ICD-10-CM | POA: Diagnosis not present

## 2023-10-20 DIAGNOSIS — F17210 Nicotine dependence, cigarettes, uncomplicated: Secondary | ICD-10-CM | POA: Diagnosis not present

## 2023-10-20 DIAGNOSIS — R918 Other nonspecific abnormal finding of lung field: Secondary | ICD-10-CM | POA: Diagnosis not present

## 2023-10-20 DIAGNOSIS — R911 Solitary pulmonary nodule: Secondary | ICD-10-CM

## 2023-11-15 ENCOUNTER — Other Ambulatory Visit: Payer: Self-pay | Admitting: Physician Assistant

## 2023-11-15 DIAGNOSIS — R0981 Nasal congestion: Secondary | ICD-10-CM

## 2023-11-22 ENCOUNTER — Other Ambulatory Visit: Payer: Self-pay

## 2023-11-22 DIAGNOSIS — F17210 Nicotine dependence, cigarettes, uncomplicated: Secondary | ICD-10-CM

## 2023-11-22 DIAGNOSIS — Z122 Encounter for screening for malignant neoplasm of respiratory organs: Secondary | ICD-10-CM

## 2023-11-22 DIAGNOSIS — Z87891 Personal history of nicotine dependence: Secondary | ICD-10-CM

## 2024-03-13 DIAGNOSIS — L821 Other seborrheic keratosis: Secondary | ICD-10-CM | POA: Diagnosis not present

## 2024-03-13 DIAGNOSIS — D225 Melanocytic nevi of trunk: Secondary | ICD-10-CM | POA: Diagnosis not present

## 2024-03-13 DIAGNOSIS — L814 Other melanin hyperpigmentation: Secondary | ICD-10-CM | POA: Diagnosis not present

## 2024-03-13 DIAGNOSIS — L578 Other skin changes due to chronic exposure to nonionizing radiation: Secondary | ICD-10-CM | POA: Diagnosis not present

## 2024-03-21 ENCOUNTER — Other Ambulatory Visit: Payer: Self-pay | Admitting: Physician Assistant

## 2024-03-21 DIAGNOSIS — G8929 Other chronic pain: Secondary | ICD-10-CM

## 2024-03-21 DIAGNOSIS — M5030 Other cervical disc degeneration, unspecified cervical region: Secondary | ICD-10-CM

## 2024-03-26 ENCOUNTER — Encounter: Admitting: Physician Assistant

## 2024-03-26 DIAGNOSIS — Z0000 Encounter for general adult medical examination without abnormal findings: Secondary | ICD-10-CM

## 2024-03-27 ENCOUNTER — Ambulatory Visit (INDEPENDENT_AMBULATORY_CARE_PROVIDER_SITE_OTHER): Admitting: Physician Assistant

## 2024-03-27 VITALS — BP 98/60 | HR 78 | Ht 72.0 in | Wt 165.0 lb

## 2024-03-27 DIAGNOSIS — F411 Generalized anxiety disorder: Secondary | ICD-10-CM | POA: Diagnosis not present

## 2024-03-27 DIAGNOSIS — E782 Mixed hyperlipidemia: Secondary | ICD-10-CM

## 2024-03-27 DIAGNOSIS — M542 Cervicalgia: Secondary | ICD-10-CM

## 2024-03-27 DIAGNOSIS — F17200 Nicotine dependence, unspecified, uncomplicated: Secondary | ICD-10-CM | POA: Diagnosis not present

## 2024-03-27 DIAGNOSIS — R351 Nocturia: Secondary | ICD-10-CM

## 2024-03-27 DIAGNOSIS — G8929 Other chronic pain: Secondary | ICD-10-CM

## 2024-03-27 DIAGNOSIS — Z131 Encounter for screening for diabetes mellitus: Secondary | ICD-10-CM

## 2024-03-27 DIAGNOSIS — Z0000 Encounter for general adult medical examination without abnormal findings: Secondary | ICD-10-CM

## 2024-03-27 DIAGNOSIS — N401 Enlarged prostate with lower urinary tract symptoms: Secondary | ICD-10-CM | POA: Diagnosis not present

## 2024-03-27 DIAGNOSIS — Z125 Encounter for screening for malignant neoplasm of prostate: Secondary | ICD-10-CM | POA: Diagnosis not present

## 2024-03-27 DIAGNOSIS — J432 Centrilobular emphysema: Secondary | ICD-10-CM | POA: Insufficient documentation

## 2024-03-27 DIAGNOSIS — M5030 Other cervical disc degeneration, unspecified cervical region: Secondary | ICD-10-CM

## 2024-03-27 DIAGNOSIS — Z1211 Encounter for screening for malignant neoplasm of colon: Secondary | ICD-10-CM

## 2024-03-27 MED ORDER — VENLAFAXINE HCL ER 75 MG PO CP24: 75.0000 mg | ORAL_CAPSULE | Freq: Every day | ORAL | 3 refills | Status: AC

## 2024-03-27 MED ORDER — VARENICLINE TARTRATE (STARTER) 0.5 MG X 11 & 1 MG X 42 PO TBPK: ORAL_TABLET | ORAL | 0 refills | Status: AC

## 2024-03-27 MED ORDER — MELOXICAM 15 MG PO TABS: ORAL_TABLET | ORAL | 3 refills | Status: AC

## 2024-03-27 MED ORDER — VARENICLINE TARTRATE 1 MG PO TABS: 1.0000 mg | ORAL_TABLET | Freq: Two times a day (BID) | ORAL | 2 refills | Status: AC

## 2024-03-27 NOTE — Patient Instructions (Signed)
 Managing the Challenge of Quitting Smoking Quitting smoking is a physical and mental challenge. You may have cravings, withdrawal symptoms, and temptation to smoke. Before quitting, work with your health care provider to make a plan that can help you manage quitting. Making a plan before you quit may keep you from smoking when you have the urge to smoke while trying to quit. How to manage lifestyle changes Managing stress Stress can make you want to smoke, and wanting to smoke may cause stress. It is important to find ways to manage your stress. You could try some of the following: Practice relaxation techniques. Breathe slowly and deeply, in through your nose and out through your mouth. Listen to music. Soak in a bath or take a shower. Imagine a peaceful place or vacation. Get some support. Talk with family or friends about your stress. Join a support group. Talk with a counselor or therapist. Get some physical activity. Go for a walk, run, or bike ride. Play a favorite sport. Practice yoga.  Medicines Talk with your health care provider about medicines that might help you deal with cravings and make quitting easier for you. Relationships Social situations can be difficult when you are quitting smoking. To manage this, you can: Avoid parties and other social situations where people might be smoking. Avoid alcohol. Leave right away if you have the urge to smoke. Explain to your family and friends that you are quitting smoking. Ask for support and let them know you might be a bit grumpy. Plan activities where smoking is not an option. General instructions Be aware that many people gain weight after they quit smoking. However, not everyone does. To keep from gaining weight, have a plan in place before you quit, and stick to the plan after you quit. Your plan should include: Eating healthy snacks. When you have a craving, it may help to: Eat popcorn, or try carrots, celery, or other cut  vegetables. Chew sugar-free gum. Changing how you eat. Eat small portion sizes at meals. Eat 4-6 small meals throughout the day instead of 1-2 large meals a day. Be mindful when you eat. You should avoid watching television or doing other things that might distract you as you eat. Exercising regularly. Make time to exercise each day. If you do not have time for a long workout, do short bouts of exercise for 5-10 minutes several times a day. Do some form of strengthening exercise, such as weight lifting. Do some exercise that gets your heart beating and causes you to breathe deeply, such as walking fast, running, swimming, or biking. This is very important. Drinking plenty of water or other low-calorie or no-calorie drinks. Drink enough fluid to keep your urine pale yellow.  How to recognize withdrawal symptoms Your body and mind may experience discomfort as you try to get used to not having nicotine in your system. These effects are called withdrawal symptoms. They may include: Feeling hungrier than normal. Having trouble concentrating. Feeling irritable or restless. Having trouble sleeping. Feeling depressed. Craving a cigarette. These symptoms may surprise you, but they are normal to have when quitting smoking. To manage withdrawal symptoms: Avoid places, people, and activities that trigger your cravings. Remember why you want to quit. Get plenty of sleep. Avoid coffee and other drinks that contain caffeine. These may worsen some of your symptoms. How to manage cravings Come up with a plan for how to deal with your cravings. The plan should include the following: A definition of the specific situation  you want to deal with. An activity or action you will take to replace smoking. A clear idea for how this action will help. The name of someone who could help you with this. Cravings usually last for 5-10 minutes. Consider taking the following actions to help you with your plan to deal  with cravings: Keep your mouth busy. Chew sugar-free gum. Suck on hard candies or a straw. Brush your teeth. Keep your hands and body busy. Change to a different activity right away. Squeeze or play with a ball. Do an activity or a hobby, such as making bead jewelry, practicing needlepoint, or working with wood. Mix up your normal routine. Take a short exercise break. Go for a quick walk, or run up and down stairs. Focus on doing something kind or helpful for someone else. Call a friend or family member to talk during a craving. Join a support group. Contact a quitline. Where to find support To get help or find a support group: Call the National Cancer Institute's Smoking Quitline: 1-800-QUIT-NOW 248-780-4209) Text QUIT to SmokefreeTXT: 521151 Where to find more information Visit these websites to find more information on quitting smoking: [place]. Department of Health and Human Services: www.smokefree.gov American Lung Association: www.freedomfromsmoking.org Centers for Disease Control and Prevention (CDC): FootballExhibition.com.br American Heart Association: www.heart.org Contact a health care provider if: You want to change your plan for quitting. The medicines you are taking are not helping. Your eating feels out of control or you cannot sleep. You feel depressed or become very anxious. Summary Quitting smoking is a physical and mental challenge. You will face cravings, withdrawal symptoms, and temptation to smoke again. Preparation can help you as you go through these challenges. Try different techniques to manage stress, handle social situations, and prevent weight gain. You can deal with cravings by keeping your mouth busy (such as by chewing gum), keeping your hands and body busy, calling family or friends, or contacting a quitline for people who want to quit smoking. You can deal with withdrawal symptoms by avoiding places where people smoke, getting plenty of rest, and avoiding drinks that  contain caffeine. This information is not intended to replace advice given to you by your health care provider. Make sure you discuss any questions you have with your health care provider. Document Revised: 06/25/2021 Document Reviewed: 06/25/2021 Elsevier Patient Education  2024 [employer]. Health Maintenance, Male Adopting a healthy lifestyle and getting preventive care are important in promoting health and wellness. Ask your health care provider about: The right schedule for you to have regular tests and exams. Things you can do on your own to prevent diseases and keep yourself healthy. What should I know about diet, weight, and exercise? Eat a healthy diet  Eat a diet that includes plenty of vegetables, fruits, low-fat dairy products, and lean protein. Do not eat a lot of foods that are high in solid fats, added sugars, or sodium. Maintain a healthy weight Body mass index (BMI) is a measurement that can be used to identify possible weight problems. It estimates body fat based on height and weight. Your health care provider can help determine your BMI and help you achieve or maintain a healthy weight. Get regular exercise Get regular exercise. This is one of the most important things you can do for your health. Most adults should: Exercise for at least 150 minutes each week. The exercise should increase your heart rate and make you sweat (moderate-intensity exercise). Do strengthening exercises at least twice a week.  This is in addition to the moderate-intensity exercise. Spend less time sitting. Even light physical activity can be beneficial. Watch cholesterol and blood lipids Have your blood tested for lipids and cholesterol at 59 years of age, then have this test every 5 years. You may need to have your cholesterol levels checked more often if: Your lipid or cholesterol levels are high. You are older than 59 years of age. You are at high risk for heart disease. What should I know  about cancer screening? Many types of cancers can be detected early and may often be prevented. Depending on your health history and family history, you may need to have cancer screening at various ages. This may include screening for: Colorectal cancer. Prostate cancer. Skin cancer. Lung cancer. What should I know about heart disease, diabetes, and high blood pressure? Blood pressure and heart disease High blood pressure causes heart disease and increases the risk of stroke. This is more likely to develop in people who have high blood pressure readings or are overweight. Talk with your health care provider about your target blood pressure readings. Have your blood pressure checked: Every 3-5 years if you are 21-39 years of age. Every year if you are 75 years old or older. If you are between the ages of 80 and 21 and are a current or former smoker, ask your health care provider if you should have a one-time screening for abdominal aortic aneurysm (AAA). Diabetes Have regular diabetes screenings. This checks your fasting blood sugar level. Have the screening done: Once every three years after age 44 if you are at a normal weight and have a low risk for diabetes. More often and at a younger age if you are overweight or have a high risk for diabetes. What should I know about preventing infection? Hepatitis B If you have a higher risk for hepatitis B, you should be screened for this virus. Talk with your health care provider to find out if you are at risk for hepatitis B infection. Hepatitis C Blood testing is recommended for: Everyone born from [birth year] through 1966. Anyone with known risk factors for hepatitis C. Sexually transmitted infections (STIs) You should be screened each year for STIs, including gonorrhea and chlamydia, if: You are sexually active and are younger than 59 years of age. You are older than 59 years of age and your health care provider tells you that you are at risk for  this type of infection. Your sexual activity has changed since you were last screened, and you are at increased risk for chlamydia or gonorrhea. Ask your health care provider if you are at risk. Ask your health care provider about whether you are at high risk for HIV. Your health care provider may recommend a prescription medicine to help prevent HIV infection. If you choose to take medicine to prevent HIV, you should first get tested for HIV. You should then be tested every 3 months for as long as you are taking the medicine. Follow these instructions at home: Alcohol use Do not drink alcohol if your health care provider tells you not to drink. If you drink alcohol: Limit how much you have to 0-2 drinks a day. Know how much alcohol is in your drink. In the [place] one drink equals one 12 oz bottle of beer (355 mL), one 5 oz glass of wine (148 mL), or one 1 oz glass of hard liquor (44 mL). Lifestyle Do not use any products that contain nicotine or tobacco. These  products include cigarettes, chewing tobacco, and vaping devices, such as e-cigarettes. If you need help quitting, ask your health care provider. Do not use street drugs. Do not share needles. Ask your health care provider for help if you need support or information about quitting drugs. General instructions Schedule regular health, dental, and eye exams. Stay current with your vaccines. Tell your health care provider if: You often feel depressed. You have ever been abused or do not feel safe at home. Summary Adopting a healthy lifestyle and getting preventive care are important in promoting health and wellness. Follow your health care provider's instructions about healthy diet, exercising, and getting tested or screened for diseases. Follow your health care provider's instructions on monitoring your cholesterol and blood pressure. This information is not intended to replace advice given to you by your health care provider. Make sure  you discuss any questions you have with your health care provider. Document Revised: 11/23/2020 Document Reviewed: 11/23/2020 Elsevier Patient Education  2024 [employer].

## 2024-03-27 NOTE — Progress Notes (Unsigned)
 Complete physical exam  Patient: Jason [name]   DOB: 1965-09-25   58 y.o. Male  MRN: [id number]  Subjective:    Chief Complaint  Patient presents with   Annual Exam    Jason [name] is a 58 y.o. male who presents today for a complete physical exam. He reports consuming a general diet. The patient has a physically strenuous job, but has no regular exercise apart from work.  He generally feels well. He reports sleeping well. He does have additional problems to discuss today.   Pt would like something to help him stop smoking. Wellbutrin  did not really help him stop but willing to try again. He is spending over 700 dollars a month on cigarettes. He is smoking 1.5 to 2 packs a day.    Most recent fall risk assessment:    08/17/2022    8:49 AM  Fall Risk   Falls in the past year? 0  Number falls in past yr: 0  Injury with Fall? 0  Risk for fall due to : No Fall Risks  Follow up Falls evaluation completed     Most recent depression screenings:    08/17/2022    8:49 AM 04/21/2021    1:16 PM  PHQ 2/9 Scores  PHQ - 2 Score 3 2  PHQ- 9 Score 11 5    Vision:Within last year, Dental: No current dental problems and Receives regular dental care, and PSA: Agrees to PSA testing  Patient Active Problem List   Diagnosis Date Noted   Tobacco dependence 03/27/2024   Centrilobular emphysema (HCC) 03/27/2024   Sarcoidosis 12/27/2022   Adenopathy 11/01/2022   Folate deficiency 08/19/2022   Degenerative disc disease, cervical 08/23/2021   SOB (shortness of breath) 04/24/2021   Nasal congestion 05/17/2018   Tension headache 04/29/2017   Cognitive changes 03/19/2017   Weakness 03/19/2017   No energy 03/19/2017   Anxiety 03/19/2017   Dysthymia 03/19/2017   Neck pain, chronic 03/19/2017   Hyperlipidemia 10/16/2014   BPH (benign prostatic hyperplasia) 10/15/2014   Enlarged prostate without lower urinary tract symptoms (luts) 10/15/2014   Tobacco abuse 08/15/2012   Tobacco use  08/15/2012   Anxiety, generalized 08/18/2011   Generalized anxiety disorder 08/18/2011   Past Medical History:  Diagnosis Date   ADHD (attention deficit hyperactivity disorder)    Anxiety    BPH (benign prostatic hyperplasia) 10/15/2014   COPD (chronic obstructive pulmonary disease) (HCC)    Depression    GERD (gastroesophageal reflux disease)    Headache    Hyperlipidemia 10/16/2014   AHA 10 year risk of 2.8% March 2016    Past Surgical History:  Procedure Laterality Date   FINE NEEDLE ASPIRATION  11/01/2022   Procedure: FINE NEEDLE ASPIRATION (FNA) LINEAR;  Surgeon: Brenna Adine CROME, DO;  Location: [facility];  Service: Pulmonary;;   VIDEO BRONCHOSCOPY WITH ENDOBRONCHIAL ULTRASOUND Right 11/01/2022   Procedure: VIDEO BRONCHOSCOPY WITH ENDOBRONCHIAL ULTRASOUND;  Surgeon: Brenna Adine CROME, DO;  Location: [facility];  Service: Pulmonary;  Laterality: Right;   Family History  Problem Relation Age of Onset   Bipolar disorder Father    Heart attack Father 104       pacemaker, smoker   Alcohol abuse Paternal Uncle    Hypertension Unknown    Alcohol abuse Unknown    Allergies  Allergen Reactions   Octacosanol     Sinus and head pressure      Patient Care Team: Jason Salsbury L, PA-C as PCP - General (  Family Medicine) Hessie Mallick, DO as Consulting Physician (Family Medicine)   Outpatient Medications Prior to Visit  Medication Sig   acetaminophen (TYLENOL) 500 MG tablet Take 500 mg by mouth every 6 (six) hours as needed for moderate pain.   ARTIFICIAL TEAR SOLUTION OP Place 1 drop into both eyes daily as needed (irritation).   atorvastatin  (LIPITOR) 20 MG tablet Take 1 tablet (20 mg total) by mouth at bedtime. (Patient not taking: Reported on 12/27/2022)   B COMPLEX VITAMINS SL Place 1 Dose under the tongue 3 (three) times a week.   buPROPion  ER (WELLBUTRIN  SR) 100 MG 12 hr tablet Take 1 tablet (100 mg total) by mouth 2 (two) times daily. (Patient not taking: Reported on  12/27/2022)   famotidine  (PEPCID ) 40 MG tablet Take 1 tablet (40 mg total) by mouth daily.   fluticasone  (FLONASE ) 50 MCG/ACT nasal spray Use 2 spray(s) in each nostril once daily   Fluticasone -Umeclidin-Vilant (TRELEGY ELLIPTA ) 100-62.5-25 MCG/ACT AEPB Inhale 1 puff into the lungs daily.   meloxicam  (MOBIC ) 15 MG tablet TAKE 1 TABLET BY MOUTH ONCE DAILY   rizatriptan (MAXALT) 10 MG tablet Take 10 mg by mouth as needed for migraine.   triamcinolone cream (KENALOG) 0.1 % Apply 1 Application topically 2 (two) times daily as needed (eczema).   venlafaxine  XR (EFFEXOR -XR) 75 MG 24 hr capsule TAKE 1 CAPSULE BY MOUTH ONCE DAILY WITH BREAKFAST   [DISCONTINUED] baclofen  (LIORESAL ) 10 MG tablet Take 1 tablet (10 mg total) by mouth at bedtime as needed for muscle spasms. (Patient not taking: Reported on 06/22/2023)   [DISCONTINUED] Fluticasone -Umeclidin-Vilant (TRELEGY ELLIPTA ) 100-62.5-25 MCG/ACT AEPB Inhale 1 puff into the lungs daily.   [DISCONTINUED] predniSONE  (DELTASONE ) 10 MG tablet Take 1 tablet by mouth once daily with breakfast   No facility-administered medications prior to visit.    ROS        Objective:     BP 98/60   Pulse 78   Ht 6' (1.829 m)   Wt 165 lb (74.8 kg)   SpO2 99%   BMI 22.38 kg/m  BP Readings from Last 3 Encounters:  03/27/24 98/60  03/14/23 124/74  12/27/22 118/74   Wt Readings from Last 3 Encounters:  03/27/24 165 lb (74.8 kg)  10/20/23 166 lb (75.3 kg)  04/17/23 166 lb (75.3 kg)      Physical Exam       Assessment & Plan:    Routine Health Maintenance and Physical Exam  Immunization History  Administered Date(s) Administered   Moderna SARS-COV2 Booster Vaccination 07/07/2020   Moderna Sars-Covid-2 Vaccination 10/10/2019, 11/07/2019   Tdap 11/08/2011, 08/17/2022    Health Maintenance  Topic Date Due   Pneumococcal Vaccine: 50+ Years (1 of 2 - PCV) Never done   Zoster Vaccines- Shingrix (1 of 2) Never done   Colonoscopy  07/19/2023    Influenza Vaccine  Never done   COVID-19 Vaccine (3 - Moderna risk series) 04/12/2024 (Originally 08/04/2020)   Hepatitis B Vaccines 19-59 Average Risk (1 of 3 - 19+ 3-dose series) 03/27/2025 (Originally 06/07/1984)   Lung Cancer Screening  10/19/2024   DTaP/Tdap/Td (3 - Td or Tdap) 08/17/2032   Hepatitis C Screening  Completed   HIV Screening  Completed   HPV VACCINES  Aged Out   Meningococcal B Vaccine  Aged Out    Discussed health benefits of physical activity, and encouraged him to engage in regular exercise appropriate for his age and condition.    No follow-ups on file.  Evana Runnels, PA-C

## 2024-03-28 LAB — CBC WITH DIFFERENTIAL/PLATELET
Basophils Absolute: 0 x10E3/uL (ref 0.0–0.2)
Basos: 1 %
EOS (ABSOLUTE): 0.1 x10E3/uL (ref 0.0–0.4)
Eos: 2 %
Hematocrit: 52 % — ABNORMAL HIGH (ref 37.5–51.0)
Hemoglobin: 16.5 g/dL (ref 13.0–17.7)
Immature Grans (Abs): 0 x10E3/uL (ref 0.0–0.1)
Immature Granulocytes: 0 %
Lymphocytes Absolute: 1.8 x10E3/uL (ref 0.7–3.1)
Lymphs: 30 %
MCH: 28.8 pg (ref 26.6–33.0)
MCHC: 31.7 g/dL (ref 31.5–35.7)
MCV: 91 fL (ref 79–97)
Monocytes Absolute: 0.4 x10E3/uL (ref 0.1–0.9)
Monocytes: 6 %
Neutrophils Absolute: 3.7 x10E3/uL (ref 1.4–7.0)
Neutrophils: 61 %
Platelets: 171 x10E3/uL (ref 150–450)
RBC: 5.73 x10E6/uL (ref 4.14–5.80)
RDW: 13.1 % (ref 11.6–15.4)
WBC: 6.1 x10E3/uL (ref 3.4–10.8)

## 2024-03-28 LAB — CMP14+EGFR
ALT: 12 IU/L (ref 0–44)
AST: 16 IU/L (ref 0–40)
Albumin: 4.3 g/dL (ref 3.8–4.9)
Alkaline Phosphatase: 83 IU/L (ref 44–121)
BUN/Creatinine Ratio: 12 (ref 9–20)
BUN: 14 mg/dL (ref 6–24)
Bilirubin Total: 0.3 mg/dL (ref 0.0–1.2)
CO2: 20 mmol/L (ref 20–29)
Calcium: 9 mg/dL (ref 8.7–10.2)
Chloride: 102 mmol/L (ref 96–106)
Creatinine, Ser: 1.19 mg/dL (ref 0.76–1.27)
Globulin, Total: 1.9 g/dL (ref 1.5–4.5)
Glucose: 95 mg/dL (ref 70–99)
Potassium: 4.3 mmol/L (ref 3.5–5.2)
Sodium: 138 mmol/L (ref 134–144)
Total Protein: 6.2 g/dL (ref 6.0–8.5)
eGFR: 71 mL/min/1.73 (ref >59)

## 2024-03-28 LAB — TSH: TSH: 1.54 u[IU]/mL (ref 0.450–4.500)

## 2024-03-28 LAB — LIPID PANEL
Chol/HDL Ratio: 4.9 ratio (ref 0.0–5.0)
Cholesterol, Total: 220 mg/dL — ABNORMAL HIGH (ref 100–199)
HDL: 45 mg/dL (ref >39)
LDL Chol Calc (NIH): 157 mg/dL — ABNORMAL HIGH (ref 0–99)
Triglycerides: 101 mg/dL (ref 0–149)
VLDL Cholesterol Cal: 18 mg/dL (ref 5–40)

## 2024-03-28 LAB — PSA, TOTAL AND FREE
PSA, Free Pct: 170 %
PSA, Free: 1.36 ng/mL
Prostate Specific Ag, Serum: 0.8 ng/mL (ref 0.0–4.0)

## 2024-03-28 LAB — VITAMIN D 25 HYDROXY (VIT D DEFICIENCY, FRACTURES): Vit D, 25-Hydroxy: 36.3 ng/mL (ref 30.0–100.0)

## 2024-03-29 ENCOUNTER — Encounter: Payer: Self-pay | Admitting: Physician Assistant

## 2024-03-29 ENCOUNTER — Ambulatory Visit: Payer: Self-pay | Admitting: Physician Assistant

## 2024-03-29 NOTE — Progress Notes (Signed)
 Daxter,   Hemoglobin and hematocrit upper limits of normal due to smoking!  LDL is elevated and 10 year risk is elevated. I would suggest you restarting lipitor. Do you want me to send to pharmacy?   SABRASABRAThe 10-year ASCVD risk score (Arnett DK, et al., 2019) is: 9.4%   Values used to calculate the score:     Age: 58 years     Clincally relevant sex: Male     Is Non-Hispanic African American: No     Diabetic: No     Tobacco smoker: Yes     Systolic Blood Pressure: 98 mmHg     Is BP treated: No     HDL Cholesterol: 45 mg/dL     Total Cholesterol: 220 mg/dL   Kidney, liver, glucose looks good.  PSA normal. Thyroid  looks good.  Vitamin D  low normal. I would take 1000 units a day.

## 2024-04-30 ENCOUNTER — Other Ambulatory Visit: Payer: Self-pay | Admitting: Physician Assistant

## 2024-06-26 ENCOUNTER — Ambulatory Visit: Admitting: Physician Assistant

## 2024-07-08 ENCOUNTER — Other Ambulatory Visit: Payer: Self-pay | Admitting: Physician Assistant

## 2024-07-08 DIAGNOSIS — R0981 Nasal congestion: Secondary | ICD-10-CM

## 2024-07-24 ENCOUNTER — Other Ambulatory Visit: Payer: Self-pay | Admitting: Physician Assistant

## 2024-07-31 ENCOUNTER — Ambulatory Visit: Admitting: Physician Assistant

## 2024-08-26 ENCOUNTER — Ambulatory Visit: Admitting: Physician Assistant

## 2024-09-16 ENCOUNTER — Ambulatory Visit: Admitting: Physician Assistant
